# Patient Record
Sex: Male | Born: 1980 | Race: Black or African American | Hispanic: No | Marital: Single | Smoking: Current every day smoker
Health system: Southern US, Community
[De-identification: ages and names within clinical notes are randomized; demographics above are authoritative.]

## PROBLEM LIST (undated history)

## (undated) DIAGNOSIS — J9383 Other pneumothorax: Secondary | ICD-10-CM

## (undated) HISTORY — PX: CHEST TUBE INSERTION: SHX231

---

## 2008-05-05 ENCOUNTER — Emergency Department (HOSPITAL_COMMUNITY): Admission: EM | Admit: 2008-05-05 | Discharge: 2008-05-05 | Payer: Self-pay | Admitting: Emergency Medicine

## 2008-11-13 ENCOUNTER — Emergency Department (HOSPITAL_COMMUNITY): Admission: EM | Admit: 2008-11-13 | Discharge: 2008-11-13 | Payer: Self-pay | Admitting: Internal Medicine

## 2011-02-07 LAB — GC/CHLAMYDIA PROBE AMP, GENITAL
Chlamydia, DNA Probe: NEGATIVE
GC Probe Amp, Genital: NEGATIVE

## 2015-01-30 ENCOUNTER — Emergency Department (HOSPITAL_COMMUNITY)
Admission: EM | Admit: 2015-01-30 | Discharge: 2015-01-30 | Disposition: A | Discharge status: Home / Self Care | Payer: Self-pay | Attending: Emergency Medicine | Admitting: Emergency Medicine

## 2015-01-30 ENCOUNTER — Encounter (HOSPITAL_COMMUNITY): Payer: Self-pay | Admitting: Emergency Medicine

## 2015-01-30 DIAGNOSIS — R197 Diarrhea, unspecified: Secondary | ICD-10-CM

## 2015-01-30 DIAGNOSIS — A084 Viral intestinal infection, unspecified: Secondary | ICD-10-CM

## 2015-01-30 DIAGNOSIS — Z720 Tobacco use: Secondary | ICD-10-CM | POA: Insufficient documentation

## 2015-01-30 DIAGNOSIS — E876 Hypokalemia: Secondary | ICD-10-CM | POA: Insufficient documentation

## 2015-01-30 DIAGNOSIS — A0839 Other viral enteritis: Secondary | ICD-10-CM | POA: Insufficient documentation

## 2015-01-30 DIAGNOSIS — R110 Nausea: Secondary | ICD-10-CM

## 2015-01-30 LAB — CBC WITH DIFFERENTIAL/PLATELET
BASOS ABS: 0 10*3/uL (ref 0.0–0.1)
Basophils Relative: 0 % (ref 0–1)
EOS PCT: 1 % (ref 0–5)
Eosinophils Absolute: 0.2 10*3/uL (ref 0.0–0.7)
HCT: 43.2 % (ref 39.0–52.0)
Hemoglobin: 14.8 g/dL (ref 13.0–17.0)
LYMPHS PCT: 30 % (ref 12–46)
Lymphs Abs: 3.2 10*3/uL (ref 0.7–4.0)
MCH: 31.2 pg (ref 26.0–34.0)
MCHC: 34.3 g/dL (ref 30.0–36.0)
MCV: 91.1 fL (ref 78.0–100.0)
Monocytes Absolute: 0.6 10*3/uL (ref 0.1–1.0)
Monocytes Relative: 6 % (ref 3–12)
NEUTROS PCT: 63 % (ref 43–77)
Neutro Abs: 6.5 10*3/uL (ref 1.7–7.7)
PLATELETS: 226 10*3/uL (ref 150–400)
RBC: 4.74 MIL/uL (ref 4.22–5.81)
RDW: 13.3 % (ref 11.5–15.5)
WBC: 10.5 10*3/uL (ref 4.0–10.5)

## 2015-01-30 LAB — COMPREHENSIVE METABOLIC PANEL
ALBUMIN: 4.2 g/dL (ref 3.5–5.2)
ALK PHOS: 78 U/L (ref 39–117)
ALT: 14 U/L (ref 0–53)
ANION GAP: 9 (ref 5–15)
AST: 23 U/L (ref 0–37)
BUN: 10 mg/dL (ref 6–23)
CO2: 23 mmol/L (ref 19–32)
Calcium: 9 mg/dL (ref 8.4–10.5)
Chloride: 105 mmol/L (ref 96–112)
Creatinine, Ser: 1.22 mg/dL (ref 0.50–1.35)
GFR calc Af Amer: 89 mL/min — ABNORMAL LOW (ref >90)
GFR calc non Af Amer: 77 mL/min — ABNORMAL LOW (ref >90)
Glucose, Bld: 94 mg/dL (ref 70–99)
POTASSIUM: 3.4 mmol/L — AB (ref 3.5–5.1)
SODIUM: 137 mmol/L (ref 135–145)
TOTAL PROTEIN: 7.2 g/dL (ref 6.0–8.3)
Total Bilirubin: 0.6 mg/dL (ref 0.3–1.2)

## 2015-01-30 LAB — URINALYSIS, ROUTINE W REFLEX MICROSCOPIC
Bilirubin Urine: NEGATIVE
Glucose, UA: NEGATIVE mg/dL
Hgb urine dipstick: NEGATIVE
Ketones, ur: NEGATIVE mg/dL
LEUKOCYTES UA: NEGATIVE
Nitrite: NEGATIVE
PROTEIN: NEGATIVE mg/dL
SPECIFIC GRAVITY, URINE: 1.035 — AB (ref 1.005–1.030)
UROBILINOGEN UA: 1 mg/dL (ref 0.0–1.0)
pH: 6 (ref 5.0–8.0)

## 2015-01-30 LAB — LIPASE, BLOOD: Lipase: 30 U/L (ref 11–59)

## 2015-01-30 MED ORDER — ONDANSETRON 4 MG PO TBDP
8.0000 mg | ORAL_TABLET | Freq: Once | ORAL | Status: AC
  Administered 2015-01-30: 8 mg via ORAL
  Filled 2015-01-30: qty 2

## 2015-01-30 MED ORDER — ONDANSETRON HCL 8 MG PO TABS: 8.0000 mg | ORAL_TABLET | Freq: Three times a day (TID) | ORAL | Status: DC | PRN

## 2015-01-30 MED ORDER — POTASSIUM CHLORIDE CRYS ER 20 MEQ PO TBCR
40.0000 meq | EXTENDED_RELEASE_TABLET | Freq: Once | ORAL | Status: AC
  Administered 2015-01-30: 40 meq via ORAL
  Filled 2015-01-30: qty 2

## 2015-01-30 NOTE — Discharge Instructions (Signed)
Use zofran as prescribed, as needed for nausea. Stay well hydrated with small sips of fluids throughout the day. Follow a BRAT (banana-rice-applesauce-toast) diet as described below for the next 24-48 hours. The 'BRAT' diet is suggested, then progress to diet as tolerated as symptoms abate. Follow up with [facility] in 1 week for recheck of symptoms. Call if bloody stools, persistent diarrhea, vomiting, fever or abdominal pain. Return to ER for changing or worsening of symptoms.  Food Choices to Help Relieve Diarrhea When you have diarrhea, the foods you eat and your eating habits are very important. Choosing the right foods and drinks can help relieve diarrhea. Also, because diarrhea can last up to 7 days, you need to replace lost fluids and electrolytes (such as sodium, potassium, and chloride) in order to help prevent dehydration.  WHAT GENERAL GUIDELINES DO I NEED TO FOLLOW?  Slowly drink 1 cup (8 oz) of fluid for each episode of diarrhea. If you are getting enough fluid, your urine will be clear or pale yellow.  Eat starchy foods. Some good choices include white rice, white toast, pasta, low-fiber cereal, baked potatoes (without the skin), saltine crackers, and bagels.  Avoid large servings of any cooked vegetables.  Limit fruit to two servings per day. A serving is  cup or 1 small piece.  Choose foods with less than 2 g of fiber per serving.  Limit fats to less than 8 tsp (38 g) per day.  Avoid fried foods.  Eat foods that have probiotics in them. Probiotics can be found in certain dairy products.  Avoid foods and beverages that may increase the speed at which food moves through the stomach and intestines (gastrointestinal tract). Things to avoid include:  High-fiber foods, such as dried fruit, raw fruits and vegetables, nuts, seeds, and whole grain foods.  Spicy foods and high-fat foods.  Foods and beverages sweetened with high-fructose corn syrup, honey, or sugar  alcohols such as xylitol, sorbitol, and mannitol. WHAT FOODS ARE RECOMMENDED? Grains White rice. White, [place], or pita breads (fresh or toasted), including plain rolls, buns, or bagels. White pasta. Saltine, soda, or graham crackers. Pretzels. Low-fiber cereal. Cooked cereals made with water (such as cornmeal, farina, or cream cereals). Plain muffins. Matzo. Melba toast. Zwieback.  Vegetables Potatoes (without the skin). Strained tomato and vegetable juices. Most well-cooked and canned vegetables without seeds. Tender lettuce. Fruits Cooked or canned applesauce, apricots, cherries, fruit cocktail, grapefruit, peaches, pears, or plums. Fresh bananas, apples without skin, cherries, grapes, cantaloupe, grapefruit, peaches, oranges, or plums.  Meat and Other Protein Products Baked or boiled chicken. Eggs. Tofu. Fish. Seafood. Smooth peanut butter. Ground or well-cooked tender beef, ham, veal, lamb, pork, or poultry.  Dairy Plain yogurt, kefir, and unsweetened liquid yogurt. Lactose-free milk, buttermilk, or soy milk. Plain hard cheese. Beverages Sport drinks. Clear broths. Diluted fruit juices (except prune). Regular, caffeine-free sodas such as ginger ale. Water. Decaffeinated teas. Oral rehydration solutions. Sugar-free beverages not sweetened with sugar alcohols. Other Bouillon, broth, or soups made from recommended foods.  The items listed above may not be a complete list of recommended foods or beverages. Contact your dietitian for more options. WHAT FOODS ARE NOT RECOMMENDED? Grains Whole grain, whole wheat, bran, or rye breads, rolls, pastas, crackers, and cereals. Wild or brown rice. Cereals that contain more than 2 g of fiber per serving. Corn tortillas or taco shells. Cooked or dry oatmeal. Granola. Popcorn. Vegetables Raw vegetables. Cabbage, broccoli, Brussels sprouts, artichokes, baked beans, beet greens, corn,  kale, legumes, peas, sweet potatoes, and yams. Potato skins. Cooked spinach  and cabbage. Fruits Dried fruit, including raisins and dates. Raw fruits. Stewed or dried prunes. Fresh apples with skin, apricots, mangoes, pears, raspberries, and strawberries.  Meat and Other Protein Products Chunky peanut butter. Nuts and seeds. Beans and lentils. Tomasa BlaseBacon.  Dairy High-fat cheeses. Milk, chocolate milk, and beverages made with milk, such as milk shakes. Cream. Ice cream. Sweets and Desserts Sweet rolls, doughnuts, and sweet breads. Pancakes and waffles. Fats and Oils Butter. Cream sauces. Margarine. Salad oils. Plain salad dressings. Olives. Avocados.  Beverages Caffeinated beverages (such as coffee, tea, soda, or energy drinks). Alcoholic beverages. Fruit juices with pulp. Prune juice. Soft drinks sweetened with high-fructose corn syrup or sugar alcohols. Other Coconut. Hot sauce. Chili powder. Mayonnaise. Gravy. Cream-based or milk-based soups.  The items listed above may not be a complete list of foods and beverages to avoid. Contact your dietitian for more information. WHAT SHOULD I DO IF I BECOME DEHYDRATED? Diarrhea can sometimes lead to dehydration. Signs of dehydration include dark urine and dry mouth and skin. If you think you are dehydrated, you should rehydrate with an oral rehydration solution. These solutions can be purchased at pharmacies, retail stores, or online.  Drink -1 cup (120-240 mL) of oral rehydration solution each time you have an episode of diarrhea. If drinking this amount makes your diarrhea worse, try drinking smaller amounts more often. For example, drink 1-3 tsp (5-15 mL) every 5-10 minutes.  A general rule for staying hydrated is to drink 1-2 L of fluid per day. Talk to your health care provider about the specific amount you should be drinking each day. Drink enough fluids to keep your urine clear or pale yellow. Document Released: 12/31/2003 Document Revised: 10/15/2013 Document Reviewed: 09/02/2013 [facility] Patient Information 2015  [place]. This information is not intended to replace advice given to you by your health care provider. Make sure you discuss any questions you have with your health care provider.   Diarrhea Diarrhea is frequent loose and watery bowel movements. It can cause you to feel weak and dehydrated. Dehydration can cause you to become tired and thirsty, have a dry mouth, and have decreased urination that often is dark yellow. Diarrhea is a sign of another problem, most often an infection that will not last long. In most cases, diarrhea typically lasts 2-3 days. However, it can last longer if it is a sign of something more serious. It is important to treat your diarrhea as directed by your caregiver to lessen or prevent future episodes of diarrhea. CAUSES  Some common causes include:  Gastrointestinal infections caused by viruses, bacteria, or parasites.  Food poisoning or food allergies.  Certain medicines, such as antibiotics, chemotherapy, and laxatives.  Artificial sweeteners and fructose.  Digestive disorders. HOME CARE INSTRUCTIONS  Ensure adequate fluid intake (hydration): Have 1 cup (8 oz) of fluid for each diarrhea episode. Avoid fluids that contain simple sugars or sports drinks, fruit juices, whole milk products, and sodas. Your urine should be clear or pale yellow if you are drinking enough fluids. Hydrate with an oral rehydration solution that you can purchase at pharmacies, retail stores, and online. You can prepare an oral rehydration solution at home by mixing the following ingredients together:   - tsp table salt.   tsp baking soda.   tsp salt substitute containing potassium chloride.  1  tablespoons sugar.  1 L (34 oz) of water.  Certain foods and beverages may increase  the speed at which food moves through the gastrointestinal (GI) tract. These foods and beverages should be avoided and include:  Caffeinated and alcoholic beverages.  High-fiber foods, such as raw  fruits and vegetables, nuts, seeds, and whole grain breads and cereals.  Foods and beverages sweetened with sugar alcohols, such as xylitol, sorbitol, and mannitol.  Some foods may be well tolerated and may help thicken stool including:  Starchy foods, such as rice, toast, pasta, low-sugar cereal, oatmeal, grits, baked potatoes, crackers, and bagels.  Bananas.  Applesauce.  Add probiotic-rich foods to help increase healthy bacteria in the GI tract, such as yogurt and fermented milk products.  Wash your hands well after each diarrhea episode.  Only take over-the-counter or prescription medicines as directed by your caregiver.  Take a warm bath to relieve any burning or pain from frequent diarrhea episodes. SEEK IMMEDIATE MEDICAL CARE IF:   You are unable to keep fluids down.  You have persistent vomiting.  You have blood in your stool, or your stools are black and tarry.  You do not urinate in 6-8 hours, or there is only a small amount of very dark urine.  You have abdominal pain that increases or localizes.  You have weakness, dizziness, confusion, or light-headedness.  You have a severe headache.  Your diarrhea gets worse or does not get better.  You have a fever or persistent symptoms for more than 2-3 days.  You have a fever and your symptoms suddenly get worse. MAKE SURE YOU:   Understand these instructions.  Will watch your condition.  Will get help right away if you are not doing well or get worse. Document Released: 09/30/2002 Document Revised: 02/24/2014 Document Reviewed: 06/17/2012 [facility] Patient Information 2015 [place], [place]. This information is not intended to replace advice given to you by your health care provider. Make sure you discuss any questions you have with your health care provider.  Nausea, Adult Nausea is the feeling that you have an upset stomach or have to vomit. Nausea by itself is not likely a serious concern, but it may be an early  sign of more serious medical problems. As nausea gets worse, it can lead to vomiting. If vomiting develops, there is the risk of dehydration.  CAUSES   Viral infections.  Food poisoning.  Medicines.  Pregnancy.  Motion sickness.  Migraine headaches.  Emotional distress.  Severe pain from any source.  Alcohol intoxication. HOME CARE INSTRUCTIONS  Get plenty of rest.  Ask your caregiver about specific rehydration instructions.  Eat small amounts of food and sip liquids more often.  Take all medicines as told by your caregiver. SEEK MEDICAL CARE IF:  You have not improved after 2 days, or you get worse.  You have a headache. SEEK IMMEDIATE MEDICAL CARE IF:   You have a fever.  You faint.  You keep vomiting or have blood in your vomit.  You are extremely weak or dehydrated.  You have dark or bloody stools.  You have severe chest or abdominal pain. MAKE SURE YOU:  Understand these instructions.  Will watch your condition.  Will get help right away if you are not doing well or get worse. Document Released: 11/17/2004 Document Revised: 07/04/2012 Document Reviewed: 06/22/2011 [facility] Patient Information 2015 [place], [place]. This information is not intended to replace advice given to you by your health care provider. Make sure you discuss any questions you have with your health care provider.  Viral Gastroenteritis Viral gastroenteritis is also called stomach flu.  This illness is caused by a certain type of germ (virus). It can cause sudden watery poop (diarrhea) and throwing up (vomiting). This can cause you to lose body fluids (dehydration). This illness usually lasts for 3 to 8 days. It usually goes away on its own. HOME CARE   Drink enough fluids to keep your pee (urine) clear or pale yellow. Drink small amounts of fluids often.  Ask your doctor how to replace body fluid losses (rehydration).  Avoid:  Foods high in sugar.  Alcohol.  Bubbly  (carbonated) drinks.  Tobacco.  Juice.  Caffeine drinks.  Very hot or cold fluids.  Fatty, greasy foods.  Eating too much at one time.  Dairy products until 24 to 48 hours after your watery poop stops.  You may eat foods with active cultures (probiotics). They can be found in some yogurts and supplements.  Wash your hands well to avoid spreading the illness.  Only take medicines as told by your doctor. Do not give aspirin to children. Do not take medicines for watery poop (antidiarrheals).  Ask your doctor if you should keep taking your regular medicines.  Keep all doctor visits as told. GET HELP RIGHT AWAY IF:   You cannot keep fluids down.  You do not pee at least once every 6 to 8 hours.  You are short of breath.  You see blood in your poop or throw up. This may look like coffee grounds.  You have belly (abdominal) pain that gets worse or is just in one small spot (localized).  You keep throwing up or having watery poop.  You have a fever.  The patient is a child younger than 3 months, and he or she has a fever.  The patient is a child older than 3 months, and he or she has a fever and problems that do not go away.  The patient is a child older than 3 months, and he or she has a fever and problems that suddenly get worse.  The patient is a baby, and he or she has no tears when crying. MAKE SURE YOU:   Understand these instructions.  Will watch your condition.  Will get help right away if you are not doing well or get worse. Document Released: 03/28/2008 Document Revised: 01/02/2012 Document Reviewed: 07/27/2011 [facility] Patient Information 2015 [place], [place]. This information is not intended to replace advice given to you by your health care provider. Make sure you discuss any questions you have with your health care provider.

## 2015-01-30 NOTE — ED Notes (Signed)
The patient says he has had diarrhea for about 5 days.  He says he has been nausea but no diarrhea.  The patient denies any pain or any other symptoms.

## 2015-01-30 NOTE — ED Provider Notes (Signed)
CSN: [id number]     Arrival date & time 01/30/15  1915 History   First MD Initiated Contact with Patient 01/30/15 2046     Chief Complaint  Patient presents with  . Diarrhea    The patient says he has had diarrhea for about 5 days.  He says he has been nausea but no diarrhea.     (Consider location/radiation/quality/duration/timing/severity/associated sxs/prior Treatment) HPI Comments: Sharyl NimrodCedric Raul Dellston is a 34 y.o. Male who presents to the ED with complaints of diarrhea 5 days. He reports that he has had 8-10 watery nonbloody episodes of diarrhea per day, associated with some mild intermittent nausea. He has not tried anything for symptoms and has no known aggravating factors. He states he occasionally has abd pain right before he has diarrhea, but it resolves with the BM, and is not currently ongoing. He denies any fevers, chills, chest pain, shortness breath, abdominal pain, vomiting, constipation, obstipation, melena, hematochezia, dysuria, hematuria, testicular pain or swelling, penile discharge, numbness, tingling, weakness, rashes, arthralgias, myalgias, recent antibiotic use, recent travel, sick contacts, suspicious food intake, alcohol use, or NSAID use.  Patient is a 34 y.o. male presenting with diarrhea. The history is provided by the patient. No language interpreter was used.  Diarrhea Quality:  Watery Severity:  Moderate Onset quality:  Gradual Number of episodes:  8-10x/day Duration:  5 days Timing:  Constant Progression:  Unchanged Relieved by:  None tried Worsened by:  Nothing tried Ineffective treatments:  None tried Associated symptoms: no abdominal pain, no arthralgias, no chills, no fever, no myalgias, no URI and no vomiting   Risk factors: no recent antibiotic use, no sick contacts, no suspicious food intake and no travel to endemic areas     History reviewed. No pertinent past medical history. History reviewed. No pertinent past surgical history. History reviewed. No  pertinent family history. History  Substance Use Topics  . Smoking status: Current Every Day Smoker -- 1.00 packs/day    Types: Cigarettes  . Smokeless tobacco: Never Used  . Alcohol Use: No    Review of Systems  Constitutional: Negative for fever and chills.  Respiratory: Negative for shortness of breath.   Cardiovascular: Negative for chest pain.  Gastrointestinal: Positive for nausea and diarrhea. Negative for vomiting, abdominal pain, constipation, blood in stool, anal bleeding and rectal pain.  Genitourinary: Negative for dysuria, hematuria, flank pain, discharge, scrotal swelling, penile pain and testicular pain.  Musculoskeletal: Negative for myalgias and arthralgias.  Skin: Negative for rash.  Allergic/Immunologic: Negative for immunocompromised state.  Neurological: Negative for weakness and numbness.  Psychiatric/Behavioral: Negative for confusion.   10 Systems reviewed and are negative for acute change except as noted in the HPI.    Allergies  Review of patient's allergies indicates no known allergies.  Home Medications   Prior to Admission medications   Medication Sig Start Date End Date Taking? Authorizing Provider  loperamide (IMODIUM A-D) 2 MG tablet Take 2 mg by mouth 4 (four) times daily as needed for diarrhea or loose stools.   Yes Historical Provider, MD   BP 127/79 mmHg  Pulse 57  Temp(Src) 97.4 F (36.3 C) (Oral)  Resp 16  Ht 6\' 1"  (1.854 m)  Wt 193 lb (87.544 kg)  BMI 25.47 kg/m2  SpO2 100% Physical Exam  Constitutional: He is oriented to person, place, and time. Vital signs are normal. He appears well-developed and well-nourished.  Non-toxic appearance. No distress.  Afebrile, nontoxic, NAD  HENT:  Head: Normocephalic and atraumatic.  Mouth/Throat: Oropharynx  is clear and moist and mucous membranes are normal.  Eyes: Conjunctivae and EOM are normal. Right eye exhibits no discharge. Left eye exhibits no discharge.  Neck: Normal range of motion.  Neck supple.  Cardiovascular: Normal rate, regular rhythm, normal heart sounds and intact distal pulses.  Exam reveals no gallop and no friction rub.   No murmur heard. Pulmonary/Chest: Effort normal and breath sounds normal. No respiratory distress. He has no decreased breath sounds. He has no wheezes. He has no rhonchi. He has no rales.  Abdominal: Soft. Normal appearance and bowel sounds are normal. He exhibits no distension. There is no tenderness. There is no rigidity, no rebound, no guarding, no CVA tenderness, no tenderness at McBurney's point and negative Murphy's sign.  Soft, NTND, +BS throughout, no r/g/r, neg murphy's, neg mcburney's, no CVA TTP   Musculoskeletal: Normal range of motion.  Neurological: He is alert and oriented to person, place, and time. He has normal strength. No sensory deficit.  Skin: Skin is warm, dry and intact. No rash noted.  Psychiatric: He has a normal mood and affect.  Nursing note and vitals reviewed.   ED Course  Procedures (including critical care time) Labs Review Labs Reviewed  COMPREHENSIVE METABOLIC PANEL - Abnormal; Notable for the following:    Potassium 3.4 (*)    GFR calc non Af Amer 77 (*)    GFR calc Af Amer 89 (*)    All other components within normal limits  URINALYSIS, ROUTINE W REFLEX MICROSCOPIC - Abnormal; Notable for the following:    Specific Gravity, Urine 1.035 (*)    All other components within normal limits  CBC WITH DIFFERENTIAL/PLATELET  LIPASE, BLOOD    Imaging Review No results found.   EKG Interpretation None      MDM   Final diagnoses:  Nausea  Diarrhea  Viral gastroenteritis  Hypokalemia    34 y.o. male here with nausea and diarrhea x5 days. No travel, sick contacts, suspicious food intake. No recent abx. No abdominal pain on exam. Labs reveal mildly low potassium. Will replete orally. Will give zofran. Pt refused IV fluids, will give PO fluids. Likely viral gastroenteritis. Will await U/A then  reassess.  10:53 PM U/A clear. Will send home with zofran and have him f/up with [facility]. I explained the diagnosis and have given explicit precautions to return to the ER including for any other new or worsening symptoms. The patient understands and accepts the medical plan as it's been dictated and I have answered their questions. Discharge instructions concerning home care and prescriptions have been given. The patient is STABLE and is discharged to home in good condition.  BP 109/50 mmHg  Pulse 60  Temp(Src) 97.4 F (36.3 C) (Oral)  Resp 24  Ht  (1.854 m)  Wt 193 lb (87.544 kg)  BMI 25.47 kg/m2  SpO2 96%  Meds ordered this encounter  Medications  . potassium chloride SA (K-DUR,KLOR-CON) CR tablet 40 mEq    Sig:   . ondansetron (ZOFRAN-ODT) disintegrating tablet 8 mg    Sig:   . ondansetron (ZOFRAN) 8 MG tablet    Sig: Take 1 tablet (8 mg total) by mouth every 8 (eight) hours as needed for nausea or vomiting.    Dispense:  10 tablet    Refill:  0    Order Specific Question:  Supervising Provider    Answer:  Eber Hong [[id number]]       Chrisette Man Camprubi-Soms, PA-C 01/30/15 2253  Ivin Booty  Jodi Mourning, MD 01/31/15 1610

## 2015-04-24 ENCOUNTER — Encounter (HOSPITAL_COMMUNITY): Payer: Self-pay | Admitting: *Deleted

## 2015-04-24 ENCOUNTER — Emergency Department (HOSPITAL_COMMUNITY)
Admission: EM | Admit: 2015-04-24 | Discharge: 2015-04-25 | Disposition: A | Discharge status: Home / Self Care | Payer: BLUE CROSS/BLUE SHIELD | Attending: Emergency Medicine | Admitting: Emergency Medicine

## 2015-04-24 DIAGNOSIS — M546 Pain in thoracic spine: Secondary | ICD-10-CM

## 2015-04-24 DIAGNOSIS — Z720 Tobacco use: Secondary | ICD-10-CM | POA: Diagnosis not present

## 2015-04-24 DIAGNOSIS — S24109A Unspecified injury at unspecified level of thoracic spinal cord, initial encounter: Secondary | ICD-10-CM | POA: Insufficient documentation

## 2015-04-24 DIAGNOSIS — Y92410 Unspecified street and highway as the place of occurrence of the external cause: Secondary | ICD-10-CM | POA: Insufficient documentation

## 2015-04-24 DIAGNOSIS — Y9389 Activity, other specified: Secondary | ICD-10-CM | POA: Diagnosis not present

## 2015-04-24 DIAGNOSIS — Y998 Other external cause status: Secondary | ICD-10-CM | POA: Diagnosis not present
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
## 2015-04-25 MED ORDER — CYCLOBENZAPRINE HCL 10 MG PO TABS: 10.0000 mg | ORAL_TABLET | Freq: Two times a day (BID) | ORAL | Status: DC | PRN

## 2015-04-25 MED ORDER — KETOROLAC TROMETHAMINE 60 MG/2ML IM SOLN
60.0000 mg | Freq: Once | INTRAMUSCULAR | Status: AC
  Administered 2015-04-25: 60 mg via INTRAMUSCULAR
  Filled 2015-04-25: qty 2

## 2015-04-25 NOTE — Discharge Instructions (Signed)
Return to the emergency room with worsening of symptoms, new symptoms or with symptoms that are concerning , especially fevers, loss of control of bladder or bowels, numbness or tingling around genital region or anus, weakness. RICE: Rest, Ice (three cycles of 20 mins on, 89mns off at least twice a day), compression/brace, elevation. Heating pad works well for back pain. Ibuprofen 4041m(2 tablets 20076mevery 5-6 hours for 3-5 days. Flexeril for severe pain. Do not operate machinery, drive or drink alcohol while taking narcotics or muscle relaxers. Follow up with urgent care if symptoms worsen or are persistent. Read below information and follow recommendations. Back Injury Prevention Back injuries can be extremely painful and difficult to heal. After having one back injury, you are much more likely to experience another later on. It is important to learn how to avoid injuring or re-injuring your back. The following tips can help you to prevent a back injury. PHYSICAL FITNESS  Exercise regularly and try to develop good tone in your abdominal muscles. Your abdominal muscles provide a lot of the support needed by your back.  Do aerobic exercises (walking, jogging, biking, swimming) regularly.  Do exercises that increase balance and strength (tai chi, yoga) regularly. This can decrease your risk of falling and injuring your back.  Stretch before and after exercising.  Maintain a healthy weight. The more you weigh, the more stress is placed on your back. For every pound of weight, 10 times that amount of pressure is placed on the back. DIET  Talk to your caregiver about how much calcium and vitamin D you need per day. These nutrients help to prevent weakening of the bones (osteoporosis). Osteoporosis can cause broken (fractured) bones that lead to back pain.  Include good sources of calcium in your diet, such as dairy products, green, leafy vegetables, and products with calcium added  (fortified).  Include good sources of vitamin D in your diet, such as milk and foods that are fortified with vitamin D.  Consider taking a nutritional supplement or a multivitamin if needed.  Stop smoking if you smoke. POSTURE  Sit and stand up straight. Avoid leaning forward when you sit or hunching over when you stand.  Choose chairs with good low back (lumbar) support.  If you work at a desk, sit close to your work so you do not need to lean over. Keep your chin tucked in. Keep your neck drawn back and elbows bent at a right angle. Your arms should look like the letter "L."  Sit high and close to the steering wheel when you drive. Add a lumbar support to your car seat if needed.  Avoid sitting or standing in one position for too long. Take breaks to get up, stretch, and walk around at least once every hour. Take breaks if you are driving for long periods of time.  Sleep on your side with your knees slightly bent, or sleep on your back with a pillow under your knees. Do not sleep on your stomach. LIFTING, TWISTING, AND REACHING  Avoid heavy lifting, especially repetitive lifting. If you must do heavy lifting:  Stretch before lifting.  Work slowly.  Rest between lifts.  Use carts and dollies to move objects when possible.  Make several small trips instead of carrying 1 heavy load.  Ask for help when you need it.  Ask for help when moving big, awkward objects.  Follow these steps when lifting:  Stand with your feet shoulder-width apart.  Get as close to the  object as you can. Do not try to pick up heavy objects that are far from your body.  Use handles or lifting straps if they are available.  Bend at your knees. Squat down, but keep your heels off the floor.  Keep your shoulders pulled back, your chin tucked in, and your back straight.  Lift the object slowly, tightening the muscles in your legs, abdomen, and buttocks. Keep the object as close to the center of your  body as possible.  When you put a load down, use these same guidelines in reverse.  Do not:  Lift the object above your waist.  Twist at the waist while lifting or carrying a load. Move your feet if you need to turn, not your waist.  Bend over without bending at your knees.  Avoid reaching over your head, across a table, or for an object on a high surface. OTHER TIPS  Avoid wet floors and keep sidewalks clear of ice to prevent falls.  Do not sleep on a mattress that is too soft or too hard.  Keep items that are used frequently within easy reach.  Put heavier objects on shelves at waist level and lighter objects on lower or higher shelves.  Find ways to decrease your stress, such as exercise, massage, or relaxation techniques. Stress can build up in your muscles. Tense muscles are more vulnerable to injury.  Seek treatment for depression or anxiety if needed. These conditions can increase your risk of developing back pain. SEEK MEDICAL CARE IF:  You injure your back.  You have questions about diet, exercise, or other ways to prevent back injuries. MAKE SURE YOU:  Understand these instructions.  Will watch your condition.  Will get help right away if you are not doing well or get worse. Document Released: 11/17/2004 Document Revised: 01/02/2012 Document Reviewed: 11/21/2011 [facility] Patient Information 2015 [place], [place]. This information is not intended to replace advice given to you by your health care provider. Make sure you discuss any questions you have with your health care provider.

## 2015-05-19 ENCOUNTER — Encounter (HOSPITAL_COMMUNITY): Payer: Self-pay | Admitting: Emergency Medicine

## 2015-05-19 ENCOUNTER — Emergency Department (HOSPITAL_COMMUNITY)
Admission: EM | Admit: 2015-05-19 | Discharge: 2015-05-19 | Disposition: A | Discharge status: Home / Self Care | Payer: BLUE CROSS/BLUE SHIELD | Attending: Emergency Medicine | Admitting: Emergency Medicine

## 2015-05-19 DIAGNOSIS — K088 Other specified disorders of teeth and supporting structures: Secondary | ICD-10-CM | POA: Insufficient documentation

## 2015-05-19 DIAGNOSIS — Z720 Tobacco use: Secondary | ICD-10-CM | POA: Insufficient documentation

## 2015-05-19 DIAGNOSIS — K0889 Other specified disorders of teeth and supporting structures: Secondary | ICD-10-CM

## 2015-05-19 MED ORDER — NAPROXEN 250 MG PO TABS: 250.0000 mg | ORAL_TABLET | Freq: Two times a day (BID) | ORAL | Status: DC

## 2015-05-19 MED ORDER — PENICILLIN V POTASSIUM 500 MG PO TABS: 500.0000 mg | ORAL_TABLET | Freq: Four times a day (QID) | ORAL | Status: DC

## 2015-05-19 MED ORDER — ACETAMINOPHEN 325 MG PO TABS
650.0000 mg | ORAL_TABLET | Freq: Once | ORAL | Status: AC
  Administered 2015-05-19: 650 mg via ORAL
  Filled 2015-05-19: qty 2

## 2015-05-19 NOTE — ED Notes (Signed)
Pt reports L lower dental pain on molar. Has filling on that molar but thinks the filling is degrading.

## 2015-05-19 NOTE — ED Provider Notes (Signed)
CSN: [id number]     Arrival date & time 05/19/15  1025 History   First MD Initiated Contact with Patient 05/19/15 1038     Chief Complaint  Patient presents with  . Dental Pain   Kenneth Arellano is a 34 y.o. male who presents to the ED complaining of left lower dental pain for several months that has worsened in the past week. He reports a cracked left lower molar. He complains of 8/10 pain and he has taken nothing for treatment today. He denies fevers, sore throat, trouble swallowing, ear pain, mouth discharge, neck pain, or facial swelling.   (Consider location/radiation/quality/duration/timing/severity/associated sxs/prior Treatment) HPI  History reviewed. No pertinent past medical history. History reviewed. No pertinent past surgical history. History reviewed. No pertinent family history. History  Substance Use Topics  . Smoking status: Current Every Day Smoker -- 1.00 packs/day    Types: Cigarettes  . Smokeless tobacco: Never Used  . Alcohol Use: No    Review of Systems  Constitutional: Negative for fever and chills.  HENT: Positive for dental problem. Negative for drooling, ear discharge, ear pain, facial swelling, mouth sores, nosebleeds, rhinorrhea, sore throat and trouble swallowing.   Musculoskeletal: Negative for neck pain and neck stiffness.  Skin: Negative for rash.  Neurological: Negative for light-headedness.      Allergies  Review of patient's allergies indicates no known allergies.  Home Medications   Prior to Admission medications   Medication Sig Start Date End Date Taking? Authorizing Provider  cyclobenzaprine (FLEXERIL) 10 MG tablet Take 1 tablet (10 mg total) by mouth 2 (two) times daily as needed for muscle spasms. 04/25/15   Oswaldo Conroy, PA-C  loperamide (IMODIUM A-D) 2 MG tablet Take 2 mg by mouth 4 (four) times daily as needed for diarrhea or loose stools.    Historical Provider, MD  naproxen (NAPROSYN) 250 MG tablet Take 1 tablet (250 mg total) by  mouth 2 (two) times daily with a meal. 05/19/15   Everlene Farrier, PA-C  ondansetron (ZOFRAN) 8 MG tablet Take 1 tablet (8 mg total) by mouth every 8 (eight) hours as needed for nausea or vomiting. 01/30/15   Mercedes Camprubi-Soms, PA-C  penicillin v potassium (VEETID) 500 MG tablet Take 1 tablet (500 mg total) by mouth 4 (four) times daily. 05/19/15   Everlene Farrier, PA-C   BP 102/69 mmHg  Pulse 57  Temp(Src) 98.2 F (36.8 C) (Oral)  Resp 16  SpO2 100% Physical Exam  Constitutional: He appears well-developed and well-nourished. No distress.  Non-toxic appearing.   HENT:  Head: Normocephalic and atraumatic.  Right Ear: External ear normal.  Left Ear: External ear normal.  Mouth/Throat: Oropharynx is clear and moist. No oropharyngeal exudate.  Tenderness to left lower molar which is cracked.  No discharge from the mouth. No facial swelling.  Uvula is midline without edema. Soft palate rises symmetrically. No tonsillar hypertrophy or exudates. Tongue protrusion is normal.  Bilateral tympanic membranes are pearly-gray without erythema or loss of landmarks.   Eyes: Conjunctivae are normal. Pupils are equal, round, and reactive to light. Right eye exhibits no discharge. Left eye exhibits no discharge.  Neck: Normal range of motion. Neck supple. No JVD present. No tracheal deviation present.  Cardiovascular: Normal rate and intact distal pulses.   Pulmonary/Chest: Effort normal. No respiratory distress.  Lymphadenopathy:    He has no cervical adenopathy.  Neurological: No cranial nerve deficit. Coordination normal.  Skin: Skin is warm and dry. No rash noted. He is not diaphoretic. No  erythema. No pallor.  Psychiatric: He has a normal mood and affect. His behavior is normal.  Nursing note and vitals reviewed.   ED Course  Procedures (including critical care time) Labs Review Labs Reviewed - No data to display  Imaging Review No results found.   EKG Interpretation None      Filed  Vitals:   05/19/15 1041  BP: 102/69  Pulse: 57  Temp: 98.2 F (36.8 C)  TempSrc: Oral  Resp: 16  SpO2: 100%    Meds given in ED:  Medications  acetaminophen (TYLENOL) tablet 650 mg (650 mg Oral Given 05/19/15 1113)    New Prescriptions   NAPROXEN (NAPROSYN) 250 MG TABLET    Take 1 tablet (250 mg total) by mouth 2 (two) times daily with a meal.   PENICILLIN V POTASSIUM (VEETID) 500 MG TABLET    Take 1 tablet (500 mg total) by mouth 4 (four) times daily.     MDM   Final diagnoses:  Pain, dental   Patient with toothache to left lower molar.  No gross abscess.  Exam unconcerning for Ludwig's angina or spread of infection.  Will treat with penicillin and pain medicine.  Urged patient to follow-up with dentist.  I advised the patient to follow-up with their primary care provider this week. I advised the patient to return to the emergency department with new or worsening symptoms or new concerns. The patient verbalized understanding and agreement with plan.     Everlene Farrier, PA-C 05/19/15 1114  Azalia Bilis, MD 05/19/15 1116

## 2015-05-19 NOTE — Discharge Instructions (Signed)
Dental Pain °A tooth ache may be caused by cavities (tooth decay). Cavities expose the nerve of the tooth to air and hot or cold temperatures. It may come from an infection or abscess (also called a boil or furuncle) around your tooth. It is also often caused by dental caries (tooth decay). This causes the pain you are having. °DIAGNOSIS  °Your caregiver can diagnose this problem by exam. °TREATMENT  °· If caused by an infection, it may be treated with medications which kill germs (antibiotics) and pain medications as prescribed by your caregiver. Take medications as directed. °· Only take over-the-counter or prescription medicines for pain, discomfort, or fever as directed by your caregiver. °· Whether the tooth ache today is caused by infection or dental disease, you should see your dentist as soon as possible for further care. °SEEK MEDICAL CARE IF: °The exam and treatment you received today has been provided on an emergency basis only. This is not a substitute for complete medical or dental care. If your problem worsens or new problems (symptoms) appear, and you are unable to meet with your dentist, call or return to this location. °SEEK IMMEDIATE MEDICAL CARE IF:  °· You have a fever. °· You develop redness and swelling of your face, jaw, or neck. °· You are unable to open your mouth. °· You have severe pain uncontrolled by pain medicine. °MAKE SURE YOU:  °· Understand these instructions. °· Will watch your condition. °· Will get help right away if you are not doing well or get worse. °Document Released: 10/10/2005 Document Revised: 01/02/2012 Document Reviewed: 05/28/2008 °ExitCare® Patient Information ©2015 [employer]. This information is not intended to replace advice given to you by your health care provider. Make sure you discuss any questions you have with your health care provider. ° °Emergency Department Resource Guide °1) Find a Doctor and Pay Out of Pocket °Although you won't have to find out who  is covered by your insurance plan, it is a good idea to ask around and get recommendations. You will then need to call the office and see if the doctor you have chosen will accept you as a new patient and what types of options they offer for patients who are self-pay. Some doctors offer discounts or will set up payment plans for their patients who do not have insurance, but you will need to ask so you aren't surprised when you get to your appointment. ° °2) Contact Your Local Health Department °Not all health departments have doctors that can see patients for sick visits, but many do, so it is worth a call to see if yours does. If you don't know where your local health department is, you can check in your phone book. The CDC also has a tool to help you locate your state's health department, and many state websites also have listings of all of their local health departments. ° °3) Find a Walk-in Clinic °If your illness is not likely to be very severe or complicated, you may want to try a walk in clinic. These are popping up all over the country in pharmacies, drugstores, and shopping centers. They're usually staffed by nurse practitioners or physician assistants that have been trained to treat common illnesses and complaints. They're usually fairly quick and inexpensive. However, if you have serious medical issues or chronic medical problems, these are probably not your best option. ° °No Primary Care Doctor: °- Call [facility] at  832-8000 - they can help you locate a primary   care doctor that  accepts your insurance, provides certain services, etc. °- Physician Referral Service- 1-800-533-3463 ° °Chronic Pain Problems: °Organization         Address  Phone   Notes  °[facility]  (336) 297-2271 Patients need to be referred by their primary care doctor.  ° °Medication Assistance: °Organization         Address  Phone   Notes  °[place] [facility] [address]  [address] °[place], [place] [address] (336) 641-8030 --Must be a resident of [place] °-- Must have NO insurance coverage whatsoever (no Medicaid/ Medicare, etc.) °-- The pt. MUST have a primary care doctor that directs their care regularly and follows them in the community °  °[facility]  (866) 331-1348   °[employer]  (888) 892-1162   ° °Agencies that provide inexpensive medical care: °Organization         Address  Phone   Notes  °[facility]  (336) 832-8035   °[facility]    (336) 832-7272   °[facility] [address] °[place], [place] [address] (336) 832-4777   °[facility] [address], °[place] (336) 271-4999   °[facility]    (336) 373-0678   °[facility]    (336) 272-1050   °[facility] ° [address], [place] Phone:  (336) 832-4444, Fax:  (336) 832-4440 Hours of Operation:  9 am - 6 pm, M-F.  Also accepts Medicaid/Medicare and self-pay.  °[facility] ° [address], [place] Phone: (336) 832-3150, Fax: (336) 832-3151. Hours of Operation:  8:30 am - 5:30 pm, M-F.  Also accepts Medicaid and self-pay.  °[facility] [place] [address], [place] Phone: (336) 878-6027   °[facility] [address], [place], [place] (336)723-1848, Ext. 123 Mondays & Thursdays: 7-9 AM.  First 15 patients are seen on a first come, first serve basis. °  ° °Medicaid-accepting [place] Providers: ° °Organization         Address  Phone   Notes  °[facility] [address], [place] (336) 641-2100 Also accepts self-pay patients.  °[facility] [address], [place] ° (336) 856-9996   °[facility] [address] [place] [address], [place] (336) 288-8857   °[facility] [address] [place] [address], [place] (336) 299-7000   °Veita Bland [address], [place]  ° (336) 373-1557 Only accepts [place] Access Medicaid patients after they have their name applied to their card.  ° °Self-Pay (no insurance) in [place]: ° °Organization         Address  Phone   Notes  °Sickle Cell Patients, [facility] [address], [place] (336) 832-1970   °[facility] [address], [place] (336) 832-4400   °[facility] ° [address], [place] (336) 992-4800   °[facility]/Dr. Osei-Bonsu ° [address] [place] [address], [place] or [address], [place] (336) 841-8500 Phone number for both [place] and [place] locations is the same.  °[facility] [address], [place] (336) 299-0000   °[facility] [address] [place] [address], [place] or [address] (336) 852-7530 °(336) 878-2260   °  [facility] [address], [place] (336) 350-1642, phone; (336) 294-5005, fax Sees patients 1st and 3rd Saturday of every month.  Must not qualify for public or private insurance (i.e. Medicaid, Medicare, [place] Health Choice, Veterans' Benefits) • Household income should be no more than 200% of the poverty level •The clinic cannot treat you if you are pregnant or think you are pregnant • Sexually transmitted diseases are not treated at the clinic.  ° ° °Dental Care: °Organization         Address  Phone  Notes  °[facility] [address], [place] (336) 641-6152 Accepts children up to age 21 who are enrolled in Medicaid or [place] Health Choice; pregnant women with a Medicaid card; and children who have applied for Medicaid or [place] Health Choice, but were declined, whose parents can pay a reduced fee at time of service.  °[facility] [place]  [address], [place] (336) 641-7733 Accepts children up to age 21 who are  enrolled in Medicaid or [place] Health Choice; pregnant women with a Medicaid card; and children who have applied for Medicaid or [place] Health Choice, but were declined, whose parents can pay a reduced fee at time of service.  °[facility] ° [address], [place] (336) 641-4533 Patients are seen by appointment only. Walk-ins are not accepted. [facility] will see patients 18 years of age and older. °Monday - Tuesday (8am-5pm) °Most Wednesdays (8:30-5pm) °$30 per visit, cash only  °[facility] ° [address], [place] (336) 641-4533 Patients are seen by appointment only. Walk-ins are not accepted. [facility] will see patients 18 years of age and older. °One Wednesday Evening (Monthly: Volunteer Based).  $30 per visit, cash only  °[facility]  (919) 537-3737 for adults; Children under age 4, call Graduate Pediatric Dentistry at (919) [phone]. Children aged 4-14, please call (919) [phone] to request a pediatric application. ° Dental services are provided in all areas of dental care including fillings, crowns and bridges, complete and partial dentures, implants, gum treatment, root canals, and extractions. Preventive care is also provided. Treatment is provided to both adults and children. °Patients are selected via a lottery and there is often a waiting list. °  °[facility] [address], °[place] ° (336) 763-8833 [email or web address] °  °[facility] [address], [place], [place] (336)723-1848, Ext. 123 Second and Fourth Thursday of each month, opens at 6:30 AM; Clinic ends at 9 AM.  Patients are seen on a first-come first-served basis, and a limited number are seen during each clinic.  ° °[facility] ° [address], [place], [place] (336) 723-7904   Eligibility Requirements °You must have lived in [place], [place], or [place] counties for at least the last three months. °  You  cannot be eligible for state or federal sponsored healthcare insurance, including Veterans Administration, Medicaid, or Medicare. °  You generally cannot be eligible for healthcare insurance through your employer.  °  How to apply: °Eligibility screenings are held every Tuesday and Wednesday afternoon from 1:00 pm until 4:00 pm. You do not need an appointment for the interview!  °[facility] [address], [place], [place] 336-631-2330   °[facility]  336-342-8273   °[facility]  336-703-3100   °[facility]  336-570-6415   ° °Behavioral Health Resources in the Community: °Intensive Outpatient Programs °Organization         Address  Phone  Notes  °[facility] [address], [place], [place] 336-878-6098   °[facility] [address], [place], [place] 336-832-9800   °[facility] [address], [place], [place] ° 336-882-2125   °[facility] [address],  °[place], [place] 1-800-853-5163 or 336-641-4981   °Substance Abuse Resources °Organization         Address  Phone  Notes  °[facility]  336-882-2125   °[facility]  336-784-9470   °[facility]  336-285-9073   °[facility]  336-845-3988   °Residential & Outpatient Substance Abuse Program  1-800-659-3381   °Psychological Services °Organization         Address  Phone  Notes  °[facility]  336- 832-9600   °[facility]  336- 378-7881   °[facility] [address], [place] 1-800-853-5163 or 336-641-4981   ° °Mobile Crisis Teams °Organization         Address  Phone  Notes  °[facility], Mobile Crisis Care Unit  1-877-626-1772   °Assertive °[facility] ° [address] [place], [place] 336-834-9664   °Sharon DeEsch [address] °[place] 336-554-5454   ° °Self-Help/Support  Groups °Organization         Address  Phone             Notes  °[facility] [place] - variety of support groups  336- 373-1402 Call for more information  °Narcotics Anonymous (NA), [facility] [address], °[place]  2 meetings at this location  ° °Residential Treatment Programs °Organization         Address  Phone  Notes  °[facility] [address],    °[place]  1-866-801-8205   °[facility] ° [address], [place], [place] 704-293-8524   °[facility] [address], [place] 336-845-3988 Admissions: 8am-3pm M-F  °[facility] [address]    °[place], [place] 336-841-1104   °[facility] [address], [place], [place] 336-379-7146   °[facility] [address]  °[place], [place] 336-285-9073   °[facility] - Intensive Outpatient [address], [place], [place] 336-852-3033   °[facility] [address]  °[place], [place] 1-877-615-2722 or 336-784-9470   °[facility] [address] [place], [place] 336-227-7417 Accepts Medicaid  °[facility] [address]  °[place] 1-800-659-3381 Substance Abuse/Addiction Treatment  ° °[place] Behavioral Health Resources °Organization         Address  Phone  Notes  °[facility]  (888) 581-9988   °Julie Brannon, PhD [address] [place], [place]   (336) 349-5553 or (336) 951-0000   °[facility]   [address] °[place], [place] (336) 349-4454   °[facility] [address], [place],  (336) 342-8316 Insurance/Medicaid/sponsorship through [facility]  °[facility] [address]                                    [place],  (336) 342-8316 Therapy/tele-psych/case  °[facility]   [address]  ° [place], [place] (336) 349-2233    °Dr. Arfeen  (336) 349-4544   °[facility] [place]  [employer] [facility] [place] [facility] 1) [address], [place] °2) [address], [place] °3)  [address], [place] (336) 349-3220 °(336) 342-7768 ° °(336) 342-8140   °[place] [facility] (336) 342-1394 or (336) 342-3537 (After Hours)    ° ° ° °

## 2015-07-07 ENCOUNTER — Emergency Department (INDEPENDENT_AMBULATORY_CARE_PROVIDER_SITE_OTHER)
Admission: EM | Admit: 2015-07-07 | Discharge: 2015-07-07 | Disposition: A | Discharge status: Home / Self Care | Payer: BLUE CROSS/BLUE SHIELD | Source: Home / Self Care | Attending: Family Medicine | Admitting: Family Medicine

## 2015-07-07 ENCOUNTER — Encounter (HOSPITAL_COMMUNITY): Payer: Self-pay | Admitting: Emergency Medicine

## 2015-07-07 DIAGNOSIS — K029 Dental caries, unspecified: Secondary | ICD-10-CM | POA: Diagnosis not present

## 2015-07-07 MED ORDER — DICLOFENAC SODIUM 75 MG PO TBEC: 75.0000 mg | DELAYED_RELEASE_TABLET | Freq: Two times a day (BID) | ORAL | Status: DC

## 2015-07-07 MED ORDER — AMOXICILLIN 875 MG PO TABS: 875.0000 mg | ORAL_TABLET | Freq: Two times a day (BID) | ORAL | Status: DC

## 2015-07-07 NOTE — ED Provider Notes (Addendum)
CSN: [id number]     Arrival date & time 07/07/15  1416 History   First MD Initiated Contact with Patient 07/07/15 1458     Chief Complaint  Patient presents with  . Dental Pain   (Consider location/radiation/quality/duration/timing/severity/associated sxs/prior Treatment) Patient is a 34 y.o. male presenting with tooth pain. The history is provided by the patient and a significant other.  Dental Pain Location:  Lower Lower teeth location:  18/LL 2nd molar Quality:  Dull and aching Severity:  Severe Onset quality:  Gradual Duration:  1 month Timing:  Constant Progression:  Worsening Chronicity:  Recurrent Context: dental fracture   Relieved by:  Nothing Ineffective treatments:  Topical anesthetic gel and NSAIDs Associated symptoms: gum swelling and headaches     History reviewed. No pertinent past medical history. History reviewed. No pertinent past surgical history. History reviewed. No pertinent family history. Social History  Substance Use Topics  . Smoking status: Current Every Day Smoker -- 1.00 packs/day    Types: Cigarettes  . Smokeless tobacco: Never Used  . Alcohol Use: No    Review of Systems  Constitutional: Negative.   Eyes: Negative.   Respiratory: Negative.   Cardiovascular: Negative.   Neurological: Positive for headaches.    Allergies  Review of patient's allergies indicates no known allergies.  Home Medications   Prior to Admission medications   Medication Sig Start Date End Date Taking? Authorizing Provider  cyclobenzaprine (FLEXERIL) 10 MG tablet Take 1 tablet (10 mg total) by mouth 2 (two) times daily as needed for muscle spasms. 04/25/15   Oswaldo Conroy, PA-C  loperamide (IMODIUM A-D) 2 MG tablet Take 2 mg by mouth 4 (four) times daily as needed for diarrhea or loose stools.    Historical Provider, MD  naproxen (NAPROSYN) 250 MG tablet Take 1 tablet (250 mg total) by mouth 2 (two) times daily with a meal. 05/19/15   Everlene Farrier, PA-C   ondansetron (ZOFRAN) 8 MG tablet Take 1 tablet (8 mg total) by mouth every 8 (eight) hours as needed for nausea or vomiting. 01/30/15   Mercedes Camprubi-Soms, PA-C  penicillin v potassium (VEETID) 500 MG tablet Take 1 tablet (500 mg total) by mouth 4 (four) times daily. 05/19/15   Everlene Farrier, PA-C   Meds Ordered and Administered this Visit  Medications - No data to display  BP 155/84 mmHg  Pulse 91  Temp(Src) 99.3 F (37.4 C) (Oral)  Resp 16  SpO2 98% No data found.   Physical Exam  Constitutional: He appears well-developed and well-nourished.  HENT:  Head: Normocephalic and atraumatic.  Left Ear: External ear normal.  Almost complete disappearance of tooth #18 was just the rim remaining. There is some gingival swelling and erythema as well.  Cardiovascular: Normal rate and regular rhythm.   Nursing note and vitals reviewed.   ED Course  Procedures (including critical care time)  Labs Review Labs Reviewed - No data to display  Imaging Review No results found.   Visual Acuity Review  Right Eye Distance:   Left Eye Distance:   Bilateral Distance:    Right Eye Near:   Left Eye Near:    Bilateral Near:         MDM  Chronic dental problems. Patient needs to stop drinking sodas, smoking.  Plan: Given Voltaren, amoxicillin, and told to find a dentist since he does have insurance.  This chart was scribed in my presence and reviewed by me personally.    ICD-9-CM ICD-10-CM   1. Dental  caries 521.00 K02.9 diclofenac (VOLTAREN) 75 MG EC tablet     amoxicillin (AMOXIL) 875 MG tablet     Signed, Elvina Sidle, MD   Signed, Elvina Sidle M.D.    Elvina Sidle, MD 07/07/15 1513  Elvina Sidle, MD 07/07/15 [id number]

## 2015-07-07 NOTE — ED Notes (Signed)
C/o left bottom tooth  Feels like abscess in mouth Antibiotics from 7/26 was taking as tx

## 2015-07-07 NOTE — Discharge Instructions (Signed)
You need to find a dentist.  There are many possible choices in [place]. One is Dr. Loni Beckwith who is located near [facility]. Another is [facility] which has an office on [address].

## 2015-07-28 ENCOUNTER — Encounter (HOSPITAL_COMMUNITY): Payer: Self-pay | Admitting: Emergency Medicine

## 2015-07-28 ENCOUNTER — Emergency Department (INDEPENDENT_AMBULATORY_CARE_PROVIDER_SITE_OTHER)
Admission: EM | Admit: 2015-07-28 | Discharge: 2015-07-28 | Disposition: A | Discharge status: Home / Self Care | Payer: BLUE CROSS/BLUE SHIELD | Source: Home / Self Care | Attending: Family Medicine | Admitting: Family Medicine

## 2015-07-28 DIAGNOSIS — K029 Dental caries, unspecified: Secondary | ICD-10-CM | POA: Diagnosis not present

## 2015-07-28 MED ORDER — AMOXICILLIN 875 MG PO TABS: 875.0000 mg | ORAL_TABLET | Freq: Two times a day (BID) | ORAL | Status: DC

## 2015-07-28 MED ORDER — DICLOFENAC SODIUM 75 MG PO TBEC: 75.0000 mg | DELAYED_RELEASE_TABLET | Freq: Two times a day (BID) | ORAL | Status: DC

## 2015-07-28 NOTE — ED Provider Notes (Signed)
CSN: [id number]     Arrival date & time 07/28/15  1937 History   First MD Initiated Contact with Patient 07/28/15 2020     Chief Complaint  Patient presents with  . Dental Pain   (Consider location/radiation/quality/duration/timing/severity/associated sxs/prior Treatment) Patient is a 34 y.o. male presenting with tooth pain. The history is provided by the patient. No language interpreter was used.  Dental Pain Onset quality:  Unable to specify Associated symptoms: no fever   Patient presents with complaint of left lower tooth pain, seen for same complaint in this office on 07/07/2015 and prescribed amoxicillin and voltaren, with recommendation to follow up with dentist.  He lost the medications after filling them and beginning to take them, did not complete course.  Has had some difficulty chewing on the left side of his mouth.   Denies fevers or chills, nausea/vomiting.   History reviewed. No pertinent past medical history. History reviewed. No pertinent past surgical history. No family history on file. Social History  Substance Use Topics  . Smoking status: Current Every Day Smoker -- 1.00 packs/day    Types: Cigarettes  . Smokeless tobacco: Never Used  . Alcohol Use: No    Review of Systems  Constitutional: Negative for fever, chills, activity change and fatigue.  HENT: Positive for dental problem.     Allergies  Review of patient's allergies indicates no known allergies.  Home Medications   Prior to Admission medications   Medication Sig Start Date End Date Taking? Authorizing Provider  amoxicillin (AMOXIL) 875 MG tablet Take 1 tablet (875 mg total) by mouth 2 (two) times daily. 07/28/15   Barbaraann Barthel, MD  diclofenac (VOLTAREN) 75 MG EC tablet Take 1 tablet (75 mg total) by mouth 2 (two) times daily. 07/28/15   Barbaraann Barthel, MD   Meds Ordered and Administered this Visit  Medications - No data to display  BP 125/62 mmHg  Pulse 66  Temp(Src) 98.1 F (36.7 C) (Oral)   Resp 16  SpO2 99% No data found.   Physical Exam  Constitutional: He appears well-developed and well-nourished. No distress.  HENT:  Molar on lower left is nearly completely missing, with slight erythema on surrounding gingiva.    Similar missing lower molar on right side without erythema.   Able to open mouth completely. Clear mucus membranes.   Eyes: Conjunctivae and EOM are normal. Pupils are equal, round, and reactive to light.  Neck: Normal range of motion. Neck supple.  Lymphadenopathy:    He has no cervical adenopathy.  Skin: He is not diaphoretic.    ED Course  Procedures (including critical care time)  Labs Review Labs Reviewed - No data to display  Imaging Review No results found.   Visual Acuity Review  Right Eye Distance:   Left Eye Distance:   Bilateral Distance:    Right Eye Near:   Left Eye Near:    Bilateral Near:         MDM   1. Dental caries        Barbaraann Barthel, MD 07/28/15 2027

## 2015-07-28 NOTE — ED Notes (Signed)
Left bottom tooth pain that started yesterday.  History of the same tooth causing patient pain.

## 2015-07-28 NOTE — Discharge Instructions (Signed)
It was a pleasure to see you today.   For the tooth infection, I am prescribing Amoxicillin , take 1 tablet by mouth two times daily for a total of 10 days.   Voltaren  tablets, take 1 tablet by mouth two times daily with something to eat.   It is very important that you find a dentist to address the tooth problems.

## 2015-09-14 ENCOUNTER — Encounter (HOSPITAL_COMMUNITY): Payer: Self-pay | Admitting: *Deleted

## 2015-09-14 ENCOUNTER — Emergency Department (HOSPITAL_COMMUNITY): Payer: No Typology Code available for payment source

## 2015-09-14 ENCOUNTER — Emergency Department (HOSPITAL_COMMUNITY)
Admission: EM | Admit: 2015-09-14 | Discharge: 2015-09-15 | Disposition: A | Discharge status: Home / Self Care | Payer: No Typology Code available for payment source | Attending: Emergency Medicine | Admitting: Emergency Medicine

## 2015-09-14 DIAGNOSIS — S0181XA Laceration without foreign body of other part of head, initial encounter: Secondary | ICD-10-CM | POA: Insufficient documentation

## 2015-09-14 DIAGNOSIS — S60512A Abrasion of left hand, initial encounter: Secondary | ICD-10-CM | POA: Diagnosis not present

## 2015-09-14 DIAGNOSIS — Z23 Encounter for immunization: Secondary | ICD-10-CM | POA: Insufficient documentation

## 2015-09-14 DIAGNOSIS — Y92410 Unspecified street and highway as the place of occurrence of the external cause: Secondary | ICD-10-CM | POA: Diagnosis not present

## 2015-09-14 DIAGNOSIS — S60511A Abrasion of right hand, initial encounter: Secondary | ICD-10-CM | POA: Insufficient documentation

## 2015-09-14 DIAGNOSIS — F17210 Nicotine dependence, cigarettes, uncomplicated: Secondary | ICD-10-CM | POA: Insufficient documentation

## 2015-09-14 DIAGNOSIS — M79643 Pain in unspecified hand: Secondary | ICD-10-CM

## 2015-09-14 DIAGNOSIS — Y998 Other external cause status: Secondary | ICD-10-CM | POA: Insufficient documentation

## 2015-09-14 DIAGNOSIS — R079 Chest pain, unspecified: Secondary | ICD-10-CM

## 2015-09-14 DIAGNOSIS — S299XXA Unspecified injury of thorax, initial encounter: Secondary | ICD-10-CM | POA: Diagnosis not present

## 2015-09-14 DIAGNOSIS — Y9389 Activity, other specified: Secondary | ICD-10-CM | POA: Diagnosis not present

## 2015-09-14 MED ORDER — TETANUS-DIPHTH-ACELL PERTUSSIS 5-2.5-18.5 LF-MCG/0.5 IM SUSP
0.5000 mL | Freq: Once | INTRAMUSCULAR | Status: AC
  Administered 2015-09-15: 0.5 mL via INTRAMUSCULAR
  Filled 2015-09-14: qty 0.5

## 2015-09-14 MED ORDER — OXYCODONE-ACETAMINOPHEN 5-325 MG PO TABS
2.0000 | ORAL_TABLET | Freq: Once | ORAL | Status: AC
  Administered 2015-09-15: 2 via ORAL
  Filled 2015-09-14: qty 2

## 2015-09-14 MED ORDER — OXYCODONE-ACETAMINOPHEN 5-325 MG PO TABS: 2.0000 | ORAL_TABLET | Freq: Four times a day (QID) | ORAL | Status: DC | PRN
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
## 2015-09-14 NOTE — ED Notes (Signed)
Pt does not recall his last tetanus shot.
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
## 2015-09-15 DIAGNOSIS — S299XXA Unspecified injury of thorax, initial encounter: Secondary | ICD-10-CM | POA: Diagnosis not present

## 2015-09-15 NOTE — ED Notes (Signed)
Both hands were cleaned with wrapped with dry gauze by emt prior to discharge. Gauze is clean, dry, and secured with tape.

## 2015-09-15 NOTE — Discharge Instructions (Signed)
Wear your seatbelt at all times. Talk with a primary care doctor to help you stop smoking. Return to the ED for worsening abdominal pain, chest pain, shortness of breath.

## 2016-01-21 ENCOUNTER — Encounter (HOSPITAL_COMMUNITY): Payer: Self-pay | Admitting: *Deleted

## 2016-01-21 ENCOUNTER — Emergency Department (HOSPITAL_COMMUNITY)
Admission: EM | Admit: 2016-01-21 | Discharge: 2016-01-21 | Disposition: A | Discharge status: Home / Self Care | Payer: BLUE CROSS/BLUE SHIELD | Attending: Emergency Medicine | Admitting: Emergency Medicine

## 2016-01-21 DIAGNOSIS — M545 Low back pain, unspecified: Secondary | ICD-10-CM

## 2016-01-21 DIAGNOSIS — F17210 Nicotine dependence, cigarettes, uncomplicated: Secondary | ICD-10-CM | POA: Diagnosis not present

## 2016-01-21 MED ORDER — LIDOCAINE 5 % EX PTCH
1.0000 | MEDICATED_PATCH | CUTANEOUS | Status: DC
  Administered 2016-01-21: 1 via TRANSDERMAL
  Filled 2016-01-21: qty 1

## 2016-01-21 MED ORDER — METHOCARBAMOL 500 MG PO TABS: 500.0000 mg | ORAL_TABLET | Freq: Two times a day (BID) | ORAL | Status: DC

## 2016-01-21 MED ORDER — OXYCODONE-ACETAMINOPHEN 5-325 MG PO TABS: 1.0000 | ORAL_TABLET | ORAL | Status: DC | PRN

## 2016-01-21 MED ORDER — METHOCARBAMOL 500 MG PO TABS
1000.0000 mg | ORAL_TABLET | Freq: Once | ORAL | Status: AC
  Administered 2016-01-21: 1000 mg via ORAL
  Filled 2016-01-21: qty 2

## 2016-01-21 MED ORDER — NAPROXEN 500 MG PO TABS: 500.0000 mg | ORAL_TABLET | Freq: Two times a day (BID) | ORAL | Status: DC

## 2016-01-21 MED ORDER — LIDOCAINE 5 % EX PTCH: 1.0000 | MEDICATED_PATCH | CUTANEOUS | Status: DC

## 2016-01-21 MED ORDER — KETOROLAC TROMETHAMINE 60 MG/2ML IM SOLN
60.0000 mg | Freq: Once | INTRAMUSCULAR | Status: AC
  Administered 2016-01-21: 60 mg via INTRAMUSCULAR
  Filled 2016-01-21: qty 2

## 2016-01-21 MED ORDER — OXYCODONE-ACETAMINOPHEN 5-325 MG PO TABS
1.0000 | ORAL_TABLET | Freq: Once | ORAL | Status: AC
  Administered 2016-01-21: 1 via ORAL
  Filled 2016-01-21: qty 1

## 2016-01-21 NOTE — Discharge Instructions (Signed)
You have been seen today for back pain. Follow up with PCP as needed if symptoms continue. Return to ED should symptoms worsen. Do not drive or do other dangerous activities while using Percocet or Robaxin.  RESOURCE GUIDE  Chronic Pain Problems: Contact [facility]  [phone] Patients need to be referred by their primary care doctor.  Insufficient Money for Medicine: Contact United Way:  call "211" or [facility] [phone].  No Primary Care Doctor: - Call [facility] Connect  [phone] - can help you locate a primary care doctor that  accepts your insurance, provides certain services, etc. - Physician Referral Service- [phone]  Agencies that provide inexpensive medical care: - [facility]  [phone] - [facility]  [phone] - [facility]  [phone] - Women's Clinic  [phone] - Planned Parenthood  [phone] Haynes Bast- [facility]  [phone]  Medicaid-accepting [facility] [place] Providers: - [facility]- [address], [address]  [phone], Mon-Fri 9am-7pm, Sat 9am-1pm - [facility]- [address], [address] [place]  [phone] - [facility]- [address], [address] [place]  [phone] [facility]- [address]  [phone] - Renaye RakersVeita Bland- [address], [address], [phone]  Only accepts [place] patients after they have their name  applied to their card  Self Pay (no insurance) in [place]: - Sickle Cell Patients: Dr Willey BladeEric Dean, [facility]  [address], [phone] - [facility]- [address]  [phone]       Redge Gainer-     [facility] [place]- [address], [address]       -     [facility]- see information above (Speak to CitigroupPam H if you do not have insurance)       -  [facility]- [address], [phone]       -  [facility]- [address],  [phone]       -  [facility]- [address] [place] [address], [phone]       -  Dr Julio Sickssei-Bonsu-  [address], [address], [place], [phone]       -  [facility]- [address], [phone]       -  [facility] [place]- [address], [phone], also [address], [phone]       -    [facility]- [address], [phone], 1st & 3rd Saturday   every month, 10am-1pm  1) Find a Doctor and Pay Out of Pocket Although you won't have to find out who is covered by your insurance plan, it is a good idea to ask around and get recommendations. You will then need to call the office and see if the doctor you have chosen will accept you as a new patient and what types of options they offer for patients who are self-pay. Some doctors offer discounts or will set up payment plans for their patients who do not have insurance, but you will need to ask so you aren't surprised when you get to your appointment.  2) Contact Your Local Health Department Not all health departments have doctors that can see patients for sick visits, but many do, so it is worth a call to see if yours does.  If you don't know where your local health department is, you can check in your phone book. The CDC also has a tool to help you locate your state's health department, and many state websites also have listings of all of their local health departments.  3) Find a Walk-in Clinic If your illness is not likely to be very severe or complicated, you may want to try a walk in clinic. These are popping up all over the country in pharmacies, drugstores, and shopping centers. They're usually staffed by nurse practitioners or physician assistants that have been trained to treat common illnesses and complaints. They're usually fairly quick and inexpensive. However, if you have serious medical issues or chronic medical problems, these are probably not your best option  STD  Testing - [facility] [place], STD Clinic, [address], [place], phone [phone] or [phone].  Monday - Friday, call for an appointment. [facility] [employer], STD Clinic, [address], [place], phone [phone] or [phone].  Monday - Friday, call for an appointment.  Abuse/Neglect: [facility] Child Abuse Hotline 747-535-1684 [facility] Child Abuse Hotline (475) 121-5152 (After Hours)  Emergency Shelter:  [facility] 4424320491  Maternity Homes: - [facility] [place] [facility] [phone] - [facility] [phone]  MRSA Hotline #:   (530) 397-3128  [facility]  [facility] [place]  United [facility] Dept. [address]                 [address]         [address] [place] [address]  Blondell Reveal Phone:  [phone]                                  Phone:  [phone]                   Phone:  [phone]  [facility], [phone] - [phone]       -     [facility] in [place], [address]                                  [phone], [facility] Child Abuse Hotline (902)028-4206 or [phone] (After Hours)   Behavioral Health Services  Substance Abuse Resources: - Alcohol and Drug Services  [phone] - [facility] [phone] - [facility] [phone] Floydene Flock [phone] - Residential & Outpatient Substance Abuse Program  [phone]  Psychological Services: [facility]  [phone] Services  [phone] - [facility], [phone] [place]. [address] [place], ACCESS LINE: 5416462694 or [phone],  EntrepreneurLoan.co.za  Dental Assistance  If unable to pay or uninsured, contact:  [facility] or [facility] to become qualified for the adult dental clinic.  Patients with Medicaid: [facility] [phone], [phone] [address], [phone]  If unable to pay, or uninsured, contact [facility] [phone]) or [facility] [phone] in [place], [phone] in [facility] [place]) to become qualified for the adult dental clinic   Other Low-Cost Community Dental Services: - [facility]- [address], [place], [place], [phone], [phone], [address]. [phone], 2nd and 4th Thursday of the month at 6:30am.  10 clients each day by appointment, can sometimes see walk-in patients if someone does not show for an appointment. [facility]- [address], [place], [place], [phone], [phone] - [facility]- [address], [place], [place], [address], [phone] - [facility] [facility]- [phone] [facility]- [phone] [facility]- [phone]

## 2016-01-21 NOTE — ED Provider Notes (Signed)
CSN: [id number]     Arrival date & time 01/21/16  2139 History  By signing my name below, I, Kenneth Arellano, attest that this documentation has been prepared under the direction and in the presence of Shawn Joy, PA-C. Electronically Signed: Doreatha Arellano, ED Scribe. 01/21/2016. 10:17 PM.    Chief Complaint  Patient presents with  . Back Pain   The history is provided by the patient. No language interpreter was used.   HPI Comments: Kenneth Arellano Reason is a 35 y.o. male who presents to the Emergency Department complaining of moderate, sharp 8/10 right lower back pain onset yesterday. Pt states he does a lot of heavy lifting at work and attributes his pain to this factor. Denies recent falls, trauma, injury, bending, twisting, MVC. He reports that pain is worsened with movement and in certain positions. He is ambulatory without difficulty. Pt states he has applied a topical ointment with no relief of pain. Pt denies taking OTC medications at home to improve symptoms. No h/o cancer, IVDU, back surgery, back injury. Denies pain radiation to legs, bowel or bladder incontinence, saddle anesthesia, fever, cough, abdominal pain, dysuria, hematuria, frequency, rash. Denies numbness, focal weakness or paresthesia of the lower extremities.    History reviewed. No pertinent past medical history. History reviewed. No pertinent past surgical history. No family history on file. Social History  Substance Use Topics  . Smoking status: Current Every Day Smoker -- 1.00 packs/day    Types: Cigarettes  . Smokeless tobacco: Never Used  . Alcohol Use: No     Comment: social    Review of Systems  Constitutional: Negative for fever and chills.  Respiratory: Negative for cough.   Gastrointestinal: Negative for nausea, vomiting and abdominal pain.  Genitourinary: Negative for dysuria, frequency and hematuria.  Musculoskeletal: Positive for back pain ( right lower).  Skin: Negative for rash.  Neurological: Negative for  weakness and numbness.    Allergies  Review of patient's allergies indicates no known allergies.  Home Medications   Prior to Admission medications   Medication Sig Start Date End Date Taking? Authorizing Provider  lidocaine (LIDODERM) 5 % Place 1 patch onto the skin daily. Remove & Discard patch within 12 hours or as directed by MD 01/21/16   Anselm Pancoast, PA-C  methocarbamol (ROBAXIN) 500 MG tablet Take 1 tablet (500 mg total) by mouth 2 (two) times daily. 01/21/16   Shawn C Joy, PA-C  naproxen (NAPROSYN) 500 MG tablet Take 1 tablet (500 mg total) by mouth 2 (two) times daily. 01/21/16   Shawn C Joy, PA-C  oxyCODONE-acetaminophen (PERCOCET/ROXICET) 5-325 MG tablet Take 2 tablets by mouth every 6 (six) hours as needed for severe pain. 09/15/15   Corena Herter, MD  oxyCODONE-acetaminophen (PERCOCET/ROXICET) 5-325 MG tablet Take 1 tablet by mouth every 4 (four) hours as needed for severe pain. 01/21/16   Shawn C Joy, PA-C   BP 114/83 mmHg  Pulse 79  Temp(Src) 98.1 F (36.7 C) (Oral)  Resp 18  SpO2 98% Physical Exam  Constitutional: He is oriented to person, place, and time. He appears well-developed and well-nourished.  HENT:  Head: Normocephalic and atraumatic.  Eyes: Conjunctivae and EOM are normal. Pupils are equal, round, and reactive to light.  Neck: Normal range of motion. Neck supple.  Cardiovascular: Normal rate.   Pulmonary/Chest: Effort normal. No respiratory distress.  Abdominal: He exhibits no distension.  Musculoskeletal: Normal range of motion. He exhibits tenderness.  Tenderness in the right lumbar musculature. No paraspinal tenderness.  Neurological: He is alert and oriented to person, place, and time.  Strength and sensation equal and intact bilaterally throughout the upper and lower extremities.Normal gait.   Skin: Skin is warm and dry.  Psychiatric: He has a normal mood and affect. His behavior is normal.  Nursing note and vitals reviewed.   ED Course   Procedures (including critical care time) DIAGNOSTIC STUDIES: Oxygen Saturation is 98% on RA, normal by my interpretation.    COORDINATION OF CARE: 10:13 PM Discussed treatment plan with pt at bedside which includes antiinflammatory, muscle relaxer and pt agreed to plan.   MDM   Final diagnoses:  Right-sided low back pain without sciatica    Jeralyn BennettCedric Riecke presents to the ED for evaluation of right lower back pain. No neurological or functional deficits.  Patient is ambulatory.  No loss of bowel or bladder control.  No evidence of cauda equina. No red flags. No fever, night sweats, weight loss, h/o cancer, IVDA, no recent procedure to back. No urinary symptoms suggestive of UTI.  Suspect muscle strain. Patient will be sent home with percocet, naprosyn, Lidoderm, robaxin. Conservative therapies discussed and recommended. Patient advised to follow up with PCP as needed, or with worsening symptoms. Patient appears stable for discharge at this time. Return precautions discussed and outlined in discharge paperwork. Patient is agreeable to plan.    Filed Vitals:   01/21/16 2152  BP: 114/83  Pulse: 79  Temp: 98.1 F (36.7 C)  Resp: 18     I personally performed the services described in this documentation, which was scribed in my presence. The recorded information has been reviewed and is accurate.   Anselm PancoastShawn C Joy, PA-C 01/21/16 2231  Pricilla LovelessScott Goldston, MD 01/26/16 1135

## 2016-01-21 NOTE — ED Notes (Signed)
Pt is here with lower back pain.  Non radiating,  No numbness, weakness or incontinence with this.  Pt has to lift heavy items for work and thinks he hurt his back yesterday.

## 2016-03-27 ENCOUNTER — Emergency Department (HOSPITAL_COMMUNITY): Payer: Self-pay

## 2016-03-27 ENCOUNTER — Encounter (HOSPITAL_COMMUNITY): Payer: Self-pay

## 2016-03-27 DIAGNOSIS — F17210 Nicotine dependence, cigarettes, uncomplicated: Secondary | ICD-10-CM | POA: Insufficient documentation

## 2016-03-27 DIAGNOSIS — R079 Chest pain, unspecified: Secondary | ICD-10-CM | POA: Insufficient documentation

## 2016-03-27 LAB — CBC
HCT: 44.4 % (ref 39.0–52.0)
HEMOGLOBIN: 15 g/dL (ref 13.0–17.0)
MCH: 30.2 pg (ref 26.0–34.0)
MCHC: 33.8 g/dL (ref 30.0–36.0)
MCV: 89.5 fL (ref 78.0–100.0)
Platelets: 221 10*3/uL (ref 150–400)
RBC: 4.96 MIL/uL (ref 4.22–5.81)
RDW: 13.1 % (ref 11.5–15.5)
WBC: 8.7 10*3/uL (ref 4.0–10.5)

## 2016-03-27 LAB — BASIC METABOLIC PANEL
ANION GAP: 5 (ref 5–15)
BUN: 12 mg/dL (ref 6–20)
CALCIUM: 9.5 mg/dL (ref 8.9–10.3)
CO2: 30 mmol/L (ref 22–32)
Chloride: 102 mmol/L (ref 101–111)
Creatinine, Ser: 1.65 mg/dL — ABNORMAL HIGH (ref 0.61–1.24)
GFR, EST NON AFRICAN AMERICAN: 53 mL/min — AB (ref >60)
Glucose, Bld: 111 mg/dL — ABNORMAL HIGH (ref 65–99)
Potassium: 4.2 mmol/L (ref 3.5–5.1)
Sodium: 137 mmol/L (ref 135–145)

## 2016-03-27 LAB — I-STAT TROPONIN, ED: TROPONIN I, POC: 0 ng/mL (ref 0.00–0.08)

## 2016-03-27 NOTE — ED Notes (Signed)
Patient complains of right sided chest pain with radiation to central chest x 1 hour. Pain worse with inspiration, no other associated symptoms. No cold or cough symptoms

## 2016-03-28 ENCOUNTER — Emergency Department (HOSPITAL_COMMUNITY)
Admission: EM | Admit: 2016-03-28 | Discharge: 2016-03-28 | Disposition: A | Discharge status: Home / Self Care | Payer: Self-pay | Attending: Emergency Medicine | Admitting: Emergency Medicine

## 2016-03-28 NOTE — ED Notes (Signed)
See note in chart from Dr. Mora Bellmanni

## 2016-03-28 NOTE — ED Notes (Signed)
Pt called again to go to room A3 but no answer in waiting room.

## 2016-03-28 NOTE — ED Provider Notes (Signed)
12:34 AM I was informed patient eloped from the emergency department waiting room at this time. His chest x-ray had showed a small apical pneumothorax. I called the patient with the phone number provided in his chart on 2 occasions and left a voicemail giving return precautions and advised for primary care follow-up.  Tomasita CrumbleAdeleke Laina Guerrieri, MD 03/28/16 [phone]

## 2016-03-28 NOTE — ED Notes (Signed)
Pt placed in B14. Pt was called in waiting room but no answer. Pt placed back in waiting room.

## 2016-03-28 NOTE — ED Notes (Signed)
Per tech first pt is not answering in waiting room.  Notified Dr. Mora Bellmanni to see if pt needs to be called to return.  Dr. Mora Bellmanni is going to call pt himself.

## 2016-12-04 ENCOUNTER — Encounter (HOSPITAL_COMMUNITY): Payer: Self-pay | Admitting: *Deleted

## 2016-12-04 ENCOUNTER — Emergency Department (HOSPITAL_COMMUNITY)
Admission: EM | Admit: 2016-12-04 | Discharge: 2016-12-04 | Disposition: A | Discharge status: Home / Self Care | Payer: BLUE CROSS/BLUE SHIELD | Attending: Emergency Medicine | Admitting: Emergency Medicine

## 2016-12-04 DIAGNOSIS — F17210 Nicotine dependence, cigarettes, uncomplicated: Secondary | ICD-10-CM | POA: Insufficient documentation

## 2016-12-04 DIAGNOSIS — R112 Nausea with vomiting, unspecified: Secondary | ICD-10-CM

## 2016-12-04 DIAGNOSIS — R197 Diarrhea, unspecified: Secondary | ICD-10-CM | POA: Insufficient documentation

## 2016-12-04 LAB — I-STAT CHEM 8, ED
BUN: 10 mg/dL (ref 6–20)
CALCIUM ION: 1.11 mmol/L — AB (ref 1.15–1.40)
CHLORIDE: 103 mmol/L (ref 101–111)
Creatinine, Ser: 1.1 mg/dL (ref 0.61–1.24)
GLUCOSE: 99 mg/dL (ref 65–99)
HCT: 49 % (ref 39.0–52.0)
Hemoglobin: 16.7 g/dL (ref 13.0–17.0)
POTASSIUM: 3.6 mmol/L (ref 3.5–5.1)
Sodium: 140 mmol/L (ref 135–145)
TCO2: 24 mmol/L (ref 0–100)

## 2016-12-04 MED ORDER — ONDANSETRON 4 MG PO TBDP
8.0000 mg | ORAL_TABLET | Freq: Once | ORAL | Status: AC
  Administered 2016-12-04: 8 mg via ORAL

## 2016-12-04 MED ORDER — ONDANSETRON HCL 4 MG PO TABS: 4.0000 mg | ORAL_TABLET | Freq: Four times a day (QID) | ORAL | 0 refills | Status: DC

## 2016-12-04 MED ORDER — ONDANSETRON 4 MG PO TBDP
ORAL_TABLET | ORAL | Status: AC
  Filled 2016-12-04: qty 2

## 2016-12-04 NOTE — ED Provider Notes (Signed)
[facility]-EMERGENCY DEPT Provider Note   CSN: [id number] Arrival date & time: 12/04/16  2035     History   Chief Complaint Chief Complaint  Patient presents with  . Diarrhea  . Emesis    HPI Kenneth Arellano is a 36 y.o. male.  Patient presents emergency department with chief complaint of nausea, vomiting, diarrhea. He states that the symptoms started this morning. He reports that his significant other has been sick with the same. He denies any fevers or chills. He denies any other associated symptoms. He feels improved with Zofran given in triage. He denies any focal abdominal pain.   The history is provided by the patient. No language interpreter was used.    History reviewed. No pertinent past medical history.  There are no active problems to display for this patient.   History reviewed. No pertinent surgical history.     Home Medications    Prior to Admission medications   Medication Sig Start Date End Date Taking? Authorizing Provider  lidocaine (LIDODERM) 5 % Place 1 patch onto the skin daily. Remove & Discard patch within 12 hours or as directed by MD 01/21/16   Anselm PancoastShawn C Joy, PA-C  methocarbamol (ROBAXIN) 500 MG tablet Take 1 tablet (500 mg total) by mouth 2 (two) times daily. 01/21/16   Shawn C Joy, PA-C  naproxen (NAPROSYN) 500 MG tablet Take 1 tablet (500 mg total) by mouth 2 (two) times daily. 01/21/16   Shawn C Joy, PA-C  ondansetron (ZOFRAN) 4 MG tablet Take 1 tablet (4 mg total) by mouth every 6 (six) hours. 12/04/16   Roxy Horsemanobert Raniyah Curenton, PA-C  oxyCODONE-acetaminophen (PERCOCET/ROXICET) 5-325 MG tablet Take 2 tablets by mouth every 6 (six) hours as needed for severe pain. 09/15/15   Corena HerterShannon Mumma, MD  oxyCODONE-acetaminophen (PERCOCET/ROXICET) 5-325 MG tablet Take 1 tablet by mouth every 4 (four) hours as needed for severe pain. 01/21/16   Anselm PancoastShawn C Joy, PA-C    Family History No family history on file.  Social History Social History  Substance Use Topics  . Smoking  status: Current Every Day Smoker    Packs/day: 1.00    Types: Cigarettes  . Smokeless tobacco: Never Used  . Alcohol use No     Comment: social     Allergies   Patient has no known allergies.   Review of Systems Review of Systems  All other systems reviewed and are negative.    Physical Exam Updated Vital Signs BP 123/78 (BP Location: Left Arm)   Pulse 86   Temp 98.7 F (37.1 C) (Oral)   Resp 16   Ht 6\' 1"  (1.854 m)   Wt 91.2 kg   SpO2 99%   BMI 26.52 kg/m   Physical Exam  Constitutional: He is oriented to person, place, and time. He appears well-developed and well-nourished.  HENT:  Head: Normocephalic and atraumatic.  Eyes: Conjunctivae and EOM are normal. Pupils are equal, round, and reactive to light. Right eye exhibits no discharge. Left eye exhibits no discharge. No scleral icterus.  Neck: Normal range of motion. Neck supple. No JVD present.  Cardiovascular: Normal rate, regular rhythm and normal heart sounds.  Exam reveals no gallop and no friction rub.   No murmur heard. Pulmonary/Chest: Effort normal and breath sounds normal. No respiratory distress. He has no wheezes. He has no rales. He exhibits no tenderness.  Abdominal: Soft. He exhibits no distension and no mass. There is no tenderness. There is no rebound and no guarding.  No focal abdominal  tenderness, no RLQ tenderness or pain at McBurney's point, no RUQ tenderness or Murphy's sign, no left-sided abdominal tenderness, no fluid wave, or signs of peritonitis   Musculoskeletal: Normal range of motion. He exhibits no edema or tenderness.  Neurological: He is alert and oriented to person, place, and time.  Skin: Skin is warm and dry.  Psychiatric: He has a normal mood and affect. His behavior is normal. Judgment and thought content normal.  Nursing note and vitals reviewed.    ED Treatments / Results  Labs (all labs ordered are listed, but only abnormal results are displayed) Labs Reviewed  I-STAT  CHEM 8, ED - Abnormal; Notable for the following:       Result Value   Calcium, Ion 1.11 (*)    All other components within normal limits    EKG  EKG Interpretation None       Radiology No results found.  Procedures Procedures (including critical care time)  Medications Ordered in ED Medications  ondansetron (ZOFRAN-ODT) disintegrating tablet 8 mg (8 mg Oral Given 12/04/16 2056)     Initial Impression / Assessment and Plan / ED Course  I have reviewed the triage vital signs and the nursing notes.  Pertinent labs & imaging results that were available during my care of the patient were reviewed by me and considered in my medical decision making (see chart for details).     Patient with nausea, vomiting, and diarrhea. Sudden onset this morning. Significant other is sick with the same. No focal abdominal tenderness on exam. Vital signs are stable. Improved with Zofran in triage. Will discharge home with Zofran. Suspect viral illness.  Final Clinical Impressions(s) / ED Diagnoses   Final diagnoses:  Nausea vomiting and diarrhea    New Prescriptions New Prescriptions   ONDANSETRON (ZOFRAN) 4 MG TABLET    Take 1 tablet (4 mg total) by mouth every 6 (six) hours.     Roxy Horseman, PA-C 12/04/16 2225    Rolland Porter, MD 12/11/16 [phone]

## 2016-12-04 NOTE — ED Notes (Signed)
Gave pt ice chips to start taking po

## 2016-12-04 NOTE — ED Notes (Signed)
ED Provider at bedside. 

## 2016-12-04 NOTE — ED Triage Notes (Signed)
Pt here for n/v/d since this am.  Significant other has same.

## 2017-01-13 ENCOUNTER — Emergency Department (HOSPITAL_COMMUNITY)
Admission: EM | Admit: 2017-01-13 | Discharge: 2017-01-13 | Disposition: A | Discharge status: Home / Self Care | Payer: BLUE CROSS/BLUE SHIELD | Attending: Emergency Medicine | Admitting: Emergency Medicine

## 2017-01-13 ENCOUNTER — Encounter (HOSPITAL_COMMUNITY): Payer: Self-pay | Admitting: Emergency Medicine

## 2017-01-13 DIAGNOSIS — R059 Cough, unspecified: Secondary | ICD-10-CM

## 2017-01-13 DIAGNOSIS — F17210 Nicotine dependence, cigarettes, uncomplicated: Secondary | ICD-10-CM | POA: Insufficient documentation

## 2017-01-13 DIAGNOSIS — M545 Low back pain: Secondary | ICD-10-CM | POA: Insufficient documentation

## 2017-01-13 DIAGNOSIS — Z79899 Other long term (current) drug therapy: Secondary | ICD-10-CM | POA: Insufficient documentation

## 2017-01-13 DIAGNOSIS — T148XXA Other injury of unspecified body region, initial encounter: Secondary | ICD-10-CM

## 2017-01-13 DIAGNOSIS — Z720 Tobacco use: Secondary | ICD-10-CM

## 2017-01-13 DIAGNOSIS — R05 Cough: Secondary | ICD-10-CM | POA: Insufficient documentation

## 2017-01-13 DIAGNOSIS — G8929 Other chronic pain: Secondary | ICD-10-CM | POA: Insufficient documentation

## 2017-01-13 DIAGNOSIS — M62830 Muscle spasm of back: Secondary | ICD-10-CM

## 2017-01-13 MED ORDER — CYCLOBENZAPRINE HCL 10 MG PO TABS: 10.0000 mg | ORAL_TABLET | Freq: Three times a day (TID) | ORAL | 0 refills | Status: DC | PRN

## 2017-01-13 MED ORDER — NAPROXEN 500 MG PO TABS: 500.0000 mg | ORAL_TABLET | Freq: Two times a day (BID) | ORAL | 0 refills | Status: DC | PRN

## 2017-01-13 NOTE — ED Provider Notes (Signed)
[facility]-EMERGENCY DEPT Provider Note   CSN: [id number] Arrival date & time: 01/13/17  2021     History   Chief Complaint Chief Complaint  Patient presents with  . Back Pain    HPI Kenneth Arellano is a 36 y.o. male with a PMHx of chronic low back pain, who presents to the ED with complaints of lower back pain that began yesterday after he was doing some heavy lifting at work. Patient states that it feels like a pulled muscle. He describes the pain is 10/10 constant sore aching and occasionally sharp nonradiating lower back pain, worse with movement, and mildly improved with hot compress, with no other tx tried PTA. He states he's had similar issues in the past related to his heavy lifting at work. He also mentions that he's had an intermittent dry cough for the last 2-3 months, which comes and goes, and wonders why he keeps having a cough. He admits to being an every day cigarette smoker. He also reports that he has seasonal allergies and thinks that this could potentially be a cause of his cough. He denies any fevers, chills, CP, SOB, hemoptysis, abd pain, N/V/D/C, hematuria, dysuria, incontinence of urine/stool, saddle anesthesia/cauda equina symptoms, myalgias, arthralgias, numbness, tingling, focal weakness, or any other complaints at this time. Denies hx of CA or IVDU.    The history is provided by the patient and medical records. No language interpreter was used.  Back Pain   This is a recurrent problem. The current episode started yesterday. The problem occurs constantly. The problem has not changed since onset.The pain is associated with lifting heavy objects. The pain is present in the lumbar spine. The quality of the pain is described as aching. The pain does not radiate. The pain is at a severity of 10/10. The pain is severe. Exacerbated by: movement. The pain is the same all the time. Pertinent negatives include no chest pain, no fever, no numbness, no abdominal pain, no bowel  incontinence, no perianal numbness, no bladder incontinence, no dysuria, no paresthesias, no paresis, no tingling and no weakness. He has tried heat for the symptoms. The treatment provided moderate relief.    History reviewed. No pertinent past medical history.  There are no active problems to display for this patient.   Past Surgical History:  Procedure Laterality Date  . CHEST TUBE INSERTION         Home Medications    Prior to Admission medications   Medication Sig Start Date End Date Taking? Authorizing Provider  lidocaine (LIDODERM) 5 % Place 1 patch onto the skin daily. Remove & Discard patch within 12 hours or as directed by MD 01/21/16   Anselm Pancoast, PA-C  methocarbamol (ROBAXIN) 500 MG tablet Take 1 tablet (500 mg total) by mouth 2 (two) times daily. 01/21/16   Shawn C Joy, PA-C  naproxen (NAPROSYN) 500 MG tablet Take 1 tablet (500 mg total) by mouth 2 (two) times daily. 01/21/16   Shawn C Joy, PA-C  ondansetron (ZOFRAN) 4 MG tablet Take 1 tablet (4 mg total) by mouth every 6 (six) hours. 12/04/16   Roxy Horseman, PA-C  oxyCODONE-acetaminophen (PERCOCET/ROXICET) 5-325 MG tablet Take 2 tablets by mouth every 6 (six) hours as needed for severe pain. 09/15/15   Corena Herter, MD  oxyCODONE-acetaminophen (PERCOCET/ROXICET) 5-325 MG tablet Take 1 tablet by mouth every 4 (four) hours as needed for severe pain. 01/21/16   Anselm Pancoast, PA-C    Family History Family History  Problem Relation  Age of Onset  . Hypertension Other     Social History Social History  Substance Use Topics  . Smoking status: Current Every Day Smoker    Packs/day: 0.50    Types: Cigarettes  . Smokeless tobacco: Never Used  . Alcohol use No     Comment: social     Allergies   Patient has no known allergies.   Review of Systems Review of Systems  Constitutional: Negative for chills and fever.  Respiratory: Positive for cough (intermittent x2-3 months). Negative for shortness of breath.     Cardiovascular: Negative for chest pain.  Gastrointestinal: Negative for abdominal pain, bowel incontinence, constipation, diarrhea, nausea and vomiting.  Genitourinary: Negative for bladder incontinence, difficulty urinating (no incontinence), dysuria and hematuria.  Musculoskeletal: Positive for back pain. Negative for arthralgias and myalgias.  Skin: Negative for color change.  Allergic/Immunologic: Positive for environmental allergies. Negative for immunocompromised state.  Neurological: Negative for tingling, weakness, numbness and paresthesias.  Psychiatric/Behavioral: Negative for confusion.   10 Systems reviewed and are negative for acute change except as noted in the HPI.   Physical Exam Updated Vital Signs BP 121/79 (BP Location: Left Arm)   Pulse 70   Temp 98.1 F (36.7 C) (Oral)   Resp 20   SpO2 96%   Physical Exam  Constitutional: He is oriented to person, place, and time. Vital signs are normal. He appears well-developed and well-nourished.  Non-toxic appearance. No distress.  Afebrile, nontoxic, NAD  HENT:  Head: Normocephalic and atraumatic.  Mouth/Throat: Oropharynx is clear and moist and mucous membranes are normal.  Eyes: Conjunctivae and EOM are normal. Right eye exhibits no discharge. Left eye exhibits no discharge.  Neck: Normal range of motion. Neck supple.  Cardiovascular: Normal rate, regular rhythm, normal heart sounds and intact distal pulses.  Exam reveals no gallop and no friction rub.   No murmur heard. Pulmonary/Chest: Effort normal and breath sounds normal. No respiratory distress. He has no decreased breath sounds. He has no wheezes. He has no rhonchi. He has no rales.  CTAB in all lung fields, no w/r/r, no hypoxia or increased WOB, speaking in full sentences, SpO2 96% on RA   Abdominal: Soft. Normal appearance and bowel sounds are normal. He exhibits no distension. There is no tenderness. There is no rigidity, no rebound, no guarding and no CVA  tenderness.  Musculoskeletal: Normal range of motion.       Lumbar back: He exhibits tenderness and spasm. He exhibits normal range of motion, no bony tenderness, no swelling and no deformity.       Back:  Lumbar spine with FROM intact without spinous process TTP, no bony stepoffs or deformities, with mild diffuse bilateral paraspinous muscle TTP and palpable muscle spasms. Strength and sensation grossly intact in all extremities, negative SLR bilaterally, gait steady and nonantalgic. No overlying skin changes. Distal pulses intact.   Neurological: He is alert and oriented to person, place, and time. He has normal strength. No sensory deficit.  Skin: Skin is warm, dry and intact. No rash noted.  Psychiatric: He has a normal mood and affect.  Nursing note and vitals reviewed.    ED Treatments / Results  Labs (all labs ordered are listed, but only abnormal results are displayed) Labs Reviewed - No data to display  EKG  EKG Interpretation None       Radiology No results found.  Procedures Procedures (including critical care time)  Medications Ordered in ED Medications - No data to display  Initial Impression / Assessment and Plan / ED Course  I have reviewed the triage vital signs and the nursing notes.  Pertinent labs & imaging results that were available during my care of the patient were reviewed by me and considered in my medical decision making (see chart for details).     36 y.o. male here with acute on chronic low back pain after doing some heavy lifting at work; hx of similar issues in the past, states it feels like a pulled muscle. No red flag s/s of low back pain. No s/s of central cord compression or cauda equina. Lower extremities are neurovascularly intact and patient is ambulating without difficulty. Tenderness diffusely in paraspinous muscles with palpable spasms. Doubt need for imaging/labs, likely muscular strain/spasm. Pt also reports intermittent cough x2-3  months, reports seasonal allergies which is likely the cause of his symptoms; also reports being a smoker, which is likely contributory; lung sounds clear, doubt need for CXR at this time, advised antihistamines, mucinex, OTC remedies, and smoking cessation.   Patient was counseled on back pain precautions and told to do activity as tolerated but do not lift, push, or pull heavy objects more than 10 pounds for the next week. Patient counseled to use ice or heat on back for no longer than 15 minutes every hour.   Rx given for muscle relaxer and counseled on proper use of muscle relaxant medication. Urged patient not to drink alcohol, drive, or perform any other activities that requires focus while taking this medication. Rx for naprosyn given. Advised use of tylenol as well.   Patient urged to follow-up with PCP if pain does not improve with treatment and rest or if pain becomes recurrent. Urged to return with worsening severe pain, loss of bowel or bladder control, trouble walking. The patient verbalizes understanding and agrees with the plan.   Final Clinical Impressions(s) / ED Diagnoses   Final diagnoses:  Chronic bilateral low back pain without sciatica  Muscle spasm of back  Muscle strain  Tobacco user  Cough    New Prescriptions New Prescriptions   CYCLOBENZAPRINE (FLEXERIL) 10 MG TABLET    Take 1 tablet (10 mg total) by mouth 3 (three) times daily as needed for muscle spasms.   NAPROXEN (NAPROSYN) 500 MG TABLET    Take 1 tablet (500 mg total) by mouth 2 (two) times daily as needed for mild pain, moderate pain or headache (TAKE WITH MEALS.).     [address]Mercedes Achol Azpeitia, PA-C 01/13/17 2106    Lavera Guiseana Duo Liu, MD 01/14/17 [phone]

## 2017-01-13 NOTE — Discharge Instructions (Signed)
For your cough: Use Mucinex for cough suppression/expectoration of mucus. May consider over-the-counter Benadryl or other antihistamine to decrease secretions and for help with your symptoms. STOP SMOKING! Follow up with your primary care doctor in 5-7 days for recheck of ongoing symptoms. Return to emergency department for emergent changing or worsening of symptoms.   For your Back Pain: Your back pain should be treated with medicines such as ibuprofen or aleve and this back pain should get better over the next 2 weeks.  However if you develop severe or worsening pain, low back pain with fever, numbness, weakness or inability to walk or urinate, you should return to the ER immediately.  Please follow up with your doctor this week for a recheck if still having symptoms.  Avoid heavy lifting over 10 pounds over the next two weeks.  Low back pain is discomfort in the lower back that may be due to injuries to muscles and ligaments around the spine.  Occasionally, it may be caused by a a problem to a part of the spine called a disc.  The pain may last several days or a week;  However, most patients get completely well in 4 weeks.  Self - care:  The application of heat can help soothe the pain.  Maintaining your daily activities, including walking, is encourged, as it will help you get better faster than just staying in bed. Perform gentle stretching as discussed. Drink plenty of fluids.  Medications are also useful to help with pain control.  A commonly prescribed medication includes tylenol; use over the counter tylenol as needed for pain relief.  Non steroidal anti inflammatory medications including Ibuprofen and naproxen;  These medications help both pain and swelling and are very useful in treating back pain.  They should be taken with food, as they can cause stomach upset, and more seriously, stomach bleeding.    Muscle relaxants:  These medications can help with muscle tightness that is a cause of  lower back pain.  Most of these medications can cause drowsiness, and it is not safe to drive or use dangerous machinery while taking them.  SEEK IMMEDIATE MEDICAL ATTENTION IF: New numbness, tingling, weakness, or problem with the use of your arms or legs.  Severe back pain not relieved with medications.  Difficulty with or loss of control of your bowel or bladder control.  Increasing pain in any areas of the body (such as chest or abdominal pain).  Shortness of breath, dizziness or fainting.  Nausea (feeling sick to your stomach), vomiting, fever, or sweats.  You will need to follow up with  Your primary healthcare provider in 1-2 weeks for reassessment.

## 2017-01-13 NOTE — ED Triage Notes (Signed)
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

## 2017-04-04 ENCOUNTER — Ambulatory Visit (HOSPITAL_COMMUNITY)
Admission: EM | Admit: 2017-04-04 | Discharge: 2017-04-04 | Disposition: A | Discharge status: Home / Self Care | Payer: BLUE CROSS/BLUE SHIELD

## 2017-04-04 ENCOUNTER — Encounter (HOSPITAL_COMMUNITY): Payer: Self-pay | Admitting: Emergency Medicine

## 2017-04-04 DIAGNOSIS — K029 Dental caries, unspecified: Secondary | ICD-10-CM

## 2017-04-04 DIAGNOSIS — K0889 Other specified disorders of teeth and supporting structures: Secondary | ICD-10-CM | POA: Diagnosis not present

## 2017-04-04 MED ORDER — ACETAMINOPHEN 325 MG PO TABS
975.0000 mg | ORAL_TABLET | Freq: Once | ORAL | Status: AC
  Administered 2017-04-04: 975 mg via ORAL

## 2017-04-04 MED ORDER — AMOXICILLIN 875 MG PO TABS: 875.0000 mg | ORAL_TABLET | Freq: Two times a day (BID) | ORAL | 0 refills | Status: DC

## 2017-04-04 MED ORDER — KETOROLAC TROMETHAMINE 60 MG/2ML IM SOLN
INTRAMUSCULAR | Status: AC
  Filled 2017-04-04: qty 2

## 2017-04-04 MED ORDER — ACETAMINOPHEN 325 MG PO TABS
ORAL_TABLET | ORAL | Status: AC
  Filled 2017-04-04: qty 3

## 2017-04-04 MED ORDER — KETOROLAC TROMETHAMINE 60 MG/2ML IM SOLN
60.0000 mg | Freq: Once | INTRAMUSCULAR | Status: AC
  Administered 2017-04-04: 60 mg via INTRAMUSCULAR

## 2017-04-04 MED ORDER — IBUPROFEN 800 MG PO TABS
800.0000 mg | ORAL_TABLET | Freq: Three times a day (TID) | ORAL | 0 refills | Status: DC
Start: 2017-04-04 — End: 2017-10-09

## 2017-04-04 NOTE — ED Provider Notes (Signed)
CSN: [id number]     Arrival date & time 04/04/17  1438 History   None    Chief Complaint  Patient presents with  . Dental Pain   (Consider location/radiation/quality/duration/timing/severity/associated sxs/prior Treatment) Patient c/o left lower jaw and dental pain.  He is seeing a Education officer, community.   The history is provided by the patient.  Dental Pain  Location:  Lower Lower teeth location:  23/LL lateral incisor Quality:  Aching Severity:  Moderate Onset quality:  Sudden Duration:  2 days Timing:  Constant Progression:  Worsening Chronicity:  New Context: abscess and cap fell off   Relieved by:  Nothing Worsened by:  Nothing Ineffective treatments:  None tried   History reviewed. No pertinent past medical history. Past Surgical History:  Procedure Laterality Date  . CHEST TUBE INSERTION     Family History  Problem Relation Age of Onset  . Hypertension Other    Social History  Substance Use Topics  . Smoking status: Current Every Day Smoker    Packs/day: 0.50    Types: Cigarettes  . Smokeless tobacco: Never Used  . Alcohol use No     Comment: social    Review of Systems  Constitutional: Negative.   HENT: Positive for dental problem.   Eyes: Negative.   Respiratory: Negative.   Cardiovascular: Negative.   Gastrointestinal: Negative.   Endocrine: Negative.   Genitourinary: Negative.   Musculoskeletal: Negative.   Allergic/Immunologic: Negative.   Neurological: Negative.   Psychiatric/Behavioral: Negative.     Allergies  Patient has no known allergies.  Home Medications   Prior to Admission medications   Medication Sig Start Date End Date Taking? Authorizing Provider  ibuprofen (ADVIL,MOTRIN) 200 MG tablet Take 200 mg by mouth every 6 (six) hours as needed.   Yes [provider]  amoxicillin (AMOXIL) 875 MG tablet Take 1 tablet (875 mg total) by mouth 2 (two) times daily. 04/04/17   Deatra Canter, FNP  cyclobenzaprine (FLEXERIL) 10 MG tablet  Take 1 tablet (10 mg total) by mouth 3 (three) times daily as needed for muscle spasms. 01/13/17   Street, [place], PA-C  ibuprofen (ADVIL,MOTRIN) 800 MG tablet Take 1 tablet (800 mg total) by mouth 3 (three) times daily. 04/04/17   Deatra Canter, FNP  lidocaine (LIDODERM) 5 % Place 1 patch onto the skin daily. Remove & Discard patch within 12 hours or as directed by MD 01/21/16   Joy, Shawn C, PA-C  methocarbamol (ROBAXIN) 500 MG tablet Take 1 tablet (500 mg total) by mouth 2 (two) times daily. 01/21/16   Joy, Shawn C, PA-C  naproxen (NAPROSYN) 500 MG tablet Take 1 tablet (500 mg total) by mouth 2 (two) times daily. 01/21/16   Joy, Shawn C, PA-C  naproxen (NAPROSYN) 500 MG tablet Take 1 tablet (500 mg total) by mouth 2 (two) times daily as needed for mild pain, moderate pain or headache (TAKE WITH MEALS.). 01/13/17   Street, [place], PA-C  ondansetron (ZOFRAN) 4 MG tablet Take 1 tablet (4 mg total) by mouth every 6 (six) hours. 12/04/16   Roxy Horseman, PA-C  oxyCODONE-acetaminophen (PERCOCET/ROXICET) 5-325 MG tablet Take 2 tablets by mouth every 6 (six) hours as needed for severe pain. 09/15/15   Corena Herter, MD  oxyCODONE-acetaminophen (PERCOCET/ROXICET) 5-325 MG tablet Take 1 tablet by mouth every 4 (four) hours as needed for severe pain. 01/21/16   Joy, Shawn C, PA-C   Meds Ordered and Administered this Visit   Medications  ketorolac (TORADOL) injection 60 mg (60  mg Intramuscular Given 04/04/17 1532)  acetaminophen (TYLENOL) tablet 975 mg (975 mg Oral Given 04/04/17 1530)    BP (!) 148/84 (BP Location: Right Arm)   Pulse 70   Temp 98.6 F (37 C) (Oral)   Resp 20   SpO2 96%  No data found.   Physical Exam  Constitutional: He appears well-developed and well-nourished.  HENT:  Head: Normocephalic and atraumatic.  Right Ear: External ear normal.  Left Ear: External ear normal.  Mouth/Throat: Oropharynx is clear and moist.  Left lower molar with crack and missing cap and  tenderness.  Eyes: Conjunctivae and EOM are normal. Pupils are equal, round, and reactive to light.  Neck: Normal range of motion. Neck supple.  Cardiovascular: Normal rate, regular rhythm and normal heart sounds.   Pulmonary/Chest: Effort normal and breath sounds normal.  Nursing note and vitals reviewed.   Urgent Care Course     Procedures (including critical care time)  Labs Review Labs Reviewed - No data to display  Imaging Review No results found.   Visual Acuity Review  Right Eye Distance:   Left Eye Distance:   Bilateral Distance:    Right Eye Near:   Left Eye Near:    Bilateral Near:         MDM   1. Pain, dental   2. Dental caries    Tylenol 975mg  po now Toradol 60mg  IM Amoxicillin 875mg  one po bid x 10 days #20      Deatra CanterOxford, Sally-Anne Wamble J, FNP 04/04/17 1542

## 2017-04-04 NOTE — ED Triage Notes (Signed)
Pain for a week, bottom, left tooth is hurting

## 2017-09-16 ENCOUNTER — Emergency Department (HOSPITAL_COMMUNITY)
Admission: EM | Admit: 2017-09-16 | Discharge: 2017-09-16 | Disposition: A | Discharge status: Home / Self Care | Payer: Self-pay | Attending: Emergency Medicine | Admitting: Emergency Medicine

## 2017-09-16 ENCOUNTER — Other Ambulatory Visit: Payer: Self-pay

## 2017-09-16 ENCOUNTER — Encounter (HOSPITAL_COMMUNITY): Payer: Self-pay | Admitting: Emergency Medicine

## 2017-09-16 DIAGNOSIS — Z202 Contact with and (suspected) exposure to infections with a predominantly sexual mode of transmission: Secondary | ICD-10-CM | POA: Insufficient documentation

## 2017-09-16 DIAGNOSIS — Z79899 Other long term (current) drug therapy: Secondary | ICD-10-CM | POA: Insufficient documentation

## 2017-09-16 DIAGNOSIS — F17210 Nicotine dependence, cigarettes, uncomplicated: Secondary | ICD-10-CM | POA: Insufficient documentation

## 2017-09-16 HISTORY — DX: Other pneumothorax: J93.83

## 2017-09-16 MED ORDER — AZITHROMYCIN 250 MG PO TABS
1000.0000 mg | ORAL_TABLET | Freq: Once | ORAL | Status: AC
  Administered 2017-09-16: 1000 mg via ORAL
  Filled 2017-09-16: qty 4

## 2017-09-16 MED ORDER — CEFTRIAXONE SODIUM 250 MG IJ SOLR
250.0000 mg | Freq: Once | INTRAMUSCULAR | Status: AC
  Administered 2017-09-16: 250 mg via INTRAMUSCULAR
  Filled 2017-09-16: qty 250

## 2017-09-16 MED ORDER — STERILE WATER FOR INJECTION IJ SOLN
INTRAMUSCULAR | Status: AC
Start: 2017-09-16 — End: 2017-09-16
  Administered 2017-09-16: 2.1 mL
  Filled 2017-09-16: qty 10

## 2017-09-16 NOTE — ED Notes (Signed)
ED Provider at bedside. 

## 2017-09-16 NOTE — ED Triage Notes (Signed)
Pt here for STD check, pt states he was notified by a unprotected sex partner they tested positive for gonorrhea. Pt denies s/s

## 2017-09-16 NOTE — Discharge Instructions (Signed)
Please read and follow all provided instructions.  Today you were cultured for a sexually transmitted disease like chlamydia or gonorrhea. You have elected to be treated at this time as a precaution. Results of your gonorrhea and chlamydia tests are pending and you will be notified if they are positive. Please refrain from sexual activity for 48 hours  It is very important to practice safe sex and use condoms when sexually active. If the test is positive, please refrain from sexual activity for 10 days for the medicine to take effect and notify your sexual partners for the last 6 months as they, too, may want to be tested. The website https://garcia.net/http://www.dontspreadit.com/ can be used to send anonymous text messages or emails to alert sexual contacts.  Because you have had unprotected sex, you may want to consider getting tested for HIV (and Syphilis) as well. If you did not elect to be tested for HIV or Syphilis (blood tests) in the department today, you can follow up at the Health Department for further testing for this. Remember that latex condoms or abstinence are the only way to prevent against STDs or HIV.  If you should develop severe or worsening pain in your abdomen or the pelvis or develop severe fevers, nausea or vomiting that prevent you from taking your medications, return to the emergency department immediately. Otherwise contact your local physician or county health department for a follow up appointment to complete STD testing including HIV and syphilis. I have attached the information for the health department.   Gonorrhea and Chlamydia SYMPTOMS  In males, symptoms include:  -Burning with urination.  -Pain in the testicles.  -Watery mucous-like discharge from the penis.  It can cause longstanding (chronic) pelvic pain after frequent infections.  TREATMENT  -You have elected to be treated at this time as a precaution. Return if you develop an itchy rash, swelling in your mouth or lips, or  difficulty breathing.  -This is a sexually transmitted infection. So you are also at risk for other sexually transmitted diseases, including HIV (AIDS), it is recommended that you get tested. HOME CARE INSTRUCTIONS  Warning: This infection is contagious. Do not have sex until treatment is completed. Follow up at your caregiver's office or the clinic to which you were referred. If your diagnosis (learning what is wrong) is confirmed by culture or some other method, your recent sexual contacts need treatment. Even if they are symptom free or have a negative culture or evaluation, they should be treated.  PREVENTION  Practice safe sex, use condoms, have only one sex partner and be sure your sex partner is not having sex with others.  Ask your caregiver to test you for chlamydia at your regular checkups or sooner if you are having symptoms.  Ask for further information if you are pregnant.  SEEK IMMEDIATE MEDICAL CARE IF:  Please return to the Emergency Department if you do not get better, if you get worse, or new symptoms OR You develop an oral temperature above 102 F (38.9 C), not controlled by medications or lasting more than 2 days.  You develop an increase in pain.  You develop any type of abnormal discharge.  You develop vaginal bleeding and it is not time for your period.  You develop painful intercourse.  Please return if you have any other emergent concerns Additional Information:  Your vital signs today were: BP 127/84 (BP Location: Right Arm)    Pulse 64    Temp 98.2 F (36.8 C) (Oral)  Resp 16    Ht 6\' 1"  (1.854 m)    Wt 90.7 kg (200 lb)    SpO2 99%    BMI 26.39 kg/m  If your blood pressure (BP) was elevated above 135/85 this visit, please have this repeated by your doctor within one month. ---------------

## 2017-09-16 NOTE — ED Provider Notes (Signed)
[facility] Provider Note   CSN: [id number] Arrival date & time: 09/16/17  2143     History   Chief Complaint Chief Complaint  Patient presents with  . Exposure to STD    HPI Kenneth Arellano is a 36 y.o. male who presents to the emergency department today for exposure to STD.  Patient states that he was notified a partner he had unprotected sex with tested positive for gonorrhea.  He denies any current symptoms including penile pain, penile discharge, testicular pain, testicular swelling, scrotal pain, ulcers/lesions/warts, pruritus, painful bowel movements or fever.  Patient is currently sexually active with 2 partners.  He has notified the other partner of his known exposure.  He has never had STDs in the past.  He denies any other symptoms at this time.  HPI  Past Medical History:  Diagnosis Date  . Spontaneous pneumothorax     There are no active problems to display for this patient.   Past Surgical History:  Procedure Laterality Date  . CHEST TUBE INSERTION         Home Medications    Prior to Admission medications   Medication Sig Start Date End Date Taking? Authorizing Provider  amoxicillin (AMOXIL) 875 MG tablet Take 1 tablet (875 mg total) by mouth 2 (two) times daily. 04/04/17   Deatra Canterxford, William J, FNP  cyclobenzaprine (FLEXERIL) 10 MG tablet Take 1 tablet (10 mg total) by mouth 3 (three) times daily as needed for muscle spasms. 01/13/17   Street, HanaleiMercedes, PA-C  ibuprofen (ADVIL,MOTRIN) 200 MG tablet Take 200 mg by mouth every 6 (six) hours as needed.    [provider]  ibuprofen (ADVIL,MOTRIN) 800 MG tablet Take 1 tablet (800 mg total) by mouth 3 (three) times daily. 04/04/17   Deatra Canterxford, William J, FNP  lidocaine (LIDODERM) 5 % Place 1 patch onto the skin daily. Remove & Discard patch within 12 hours or as directed by MD 01/21/16   Joy, Shawn C, PA-C  methocarbamol (ROBAXIN) 500 MG tablet Take 1 tablet (500 mg total)  by mouth 2 (two) times daily. 01/21/16   Joy, Shawn C, PA-C  naproxen (NAPROSYN) 500 MG tablet Take 1 tablet (500 mg total) by mouth 2 (two) times daily. 01/21/16   Joy, Shawn C, PA-C  naproxen (NAPROSYN) 500 MG tablet Take 1 tablet (500 mg total) by mouth 2 (two) times daily as needed for mild pain, moderate pain or headache (TAKE WITH MEALS.). 01/13/17   Street, Mercedes, PA-C  ondansetron (ZOFRAN) 4 MG tablet Take 1 tablet (4 mg total) by mouth every 6 (six) hours. 12/04/16   Roxy HorsemanBrowning, Robert, PA-C  oxyCODONE-acetaminophen (PERCOCET/ROXICET) 5-325 MG tablet Take 2 tablets by mouth every 6 (six) hours as needed for severe pain. 09/15/15   Corena HerterMumma, Shannon, MD  oxyCODONE-acetaminophen (PERCOCET/ROXICET) 5-325 MG tablet Take 1 tablet by mouth every 4 (four) hours as needed for severe pain. 01/21/16   Joy, Hillard DankerShawn C, PA-C    Family History Family History  Problem Relation Age of Onset  . Hypertension Other     Social History Social History   Tobacco Use  . Smoking status: Current Every Day Smoker    Packs/day: 0.50    Types: Cigarettes  . Smokeless tobacco: Never Used  Substance Use Topics  . Alcohol use: Yes    Comment: social  . Drug use: Yes    Types: Marijuana     Allergies   Patient has no known allergies.   Review of  Systems Review of Systems  Constitutional: Negative for chills and fever.  Gastrointestinal: Negative for abdominal pain, nausea and vomiting.       No painful bowel movements  Genitourinary: Negative for difficulty urinating, discharge, dysuria, flank pain, frequency, hematuria, penile pain, penile swelling, scrotal swelling, testicular pain and urgency.  Skin: Negative for rash.     Physical Exam Updated Vital Signs BP 127/84 (BP Location: Right Arm)   Pulse 64   Temp 98.2 F (36.8 C) (Oral)   Resp 16   Ht 6\' 1"  (1.854 m)   Wt 90.7 kg (200 lb)   SpO2 99%   BMI 26.39 kg/m   Physical Exam  Constitutional: He appears well-developed and well-nourished.   HENT:  Head: Normocephalic and atraumatic.  Right Ear: External ear normal.  Left Ear: External ear normal.  Eyes: Conjunctivae are normal. Right eye exhibits no discharge. Left eye exhibits no discharge. No scleral icterus.  Pulmonary/Chest: Effort normal. No respiratory distress.  Abdominal: Normal appearance and bowel sounds are normal. He exhibits no distension. There is no tenderness. There is no rigidity, no rebound, no guarding and no CVA tenderness.  Genitourinary: Testes normal and penis normal. Cremasteric reflex is present. Right testis shows no mass, no swelling and no tenderness. Right testis is descended. Left testis shows no mass, no swelling and no tenderness. Left testis is descended. Circumcised. No phimosis, penile erythema or penile tenderness. No discharge found.  Genitourinary Comments: No rash or lesions visualized.  Lymphadenopathy: No inguinal adenopathy noted on the right or left side.  Neurological: He is alert.  Skin: No pallor.  Psychiatric: He has a normal mood and affect.  Nursing note and vitals reviewed.    ED Treatments / Results  Labs (all labs ordered are listed, but only abnormal results are displayed) Labs Reviewed  RPR  HIV ANTIBODY (ROUTINE TESTING)  GC/CHLAMYDIA PROBE AMP ([place]) NOT AT [facility]    EKG  EKG Interpretation None       Radiology No results found.  Procedures Procedures (including critical care time)  Medications Ordered in ED Medications  azithromycin (ZITHROMAX) tablet 1,000 mg (not administered)  cefTRIAXone (ROCEPHIN) injection 250 mg (not administered)     Initial Impression / Assessment and Plan / ED Course  I have reviewed the triage vital signs and the nursing notes.  Pertinent labs & imaging results that were available during my care of the patient were reviewed by me and considered in my medical decision making (see chart for details).     Patient is afebrile without abdominal tenderness,  abdominal pain or painful bowel movements to indicate prostatitis.  No tenderness to palpation of the testes or epididymis to suggest orchitis or epididymitis.  STD cultures obtained including HIV, syphilis, gonorrhea and chlamydia. Patient to be discharged with instructions to follow up with PCP. Discussed importance of using protection when sexually active. Pt understands that they have GC/Chlamydia cultures pending and that they will need to inform all sexual partners if results return positive. Patient has been treated prophylactically with azithromycin and Rocephin. Patient to follow up with PCP/health department.   Final Clinical Impressions(s) / ED Diagnoses   Final diagnoses:  STD exposure    ED Discharge Orders    None       Princella PellegriniMaczis, Ruth Tully M, PA-C 09/16/17 2226    Lorre NickAllen, Anthony, MD 09/17/17 1655

## 2017-09-17 LAB — RPR: RPR: NONREACTIVE

## 2017-09-17 LAB — HIV ANTIBODY (ROUTINE TESTING W REFLEX): HIV SCREEN 4TH GENERATION: NONREACTIVE

## 2017-09-18 LAB — GC/CHLAMYDIA PROBE AMP (~~LOC~~) NOT AT ARMC
Chlamydia: NEGATIVE
NEISSERIA GONORRHEA: NEGATIVE

## 2017-10-09 ENCOUNTER — Other Ambulatory Visit: Payer: Self-pay

## 2017-10-09 ENCOUNTER — Emergency Department (HOSPITAL_COMMUNITY)
Admission: EM | Admit: 2017-10-09 | Discharge: 2017-10-09 | Disposition: A | Discharge status: Home / Self Care | Payer: Self-pay | Attending: Emergency Medicine | Admitting: Emergency Medicine

## 2017-10-09 ENCOUNTER — Encounter (HOSPITAL_COMMUNITY): Payer: Self-pay | Admitting: *Deleted

## 2017-10-09 DIAGNOSIS — Z79899 Other long term (current) drug therapy: Secondary | ICD-10-CM | POA: Insufficient documentation

## 2017-10-09 DIAGNOSIS — K0889 Other specified disorders of teeth and supporting structures: Secondary | ICD-10-CM | POA: Insufficient documentation

## 2017-10-09 DIAGNOSIS — F17210 Nicotine dependence, cigarettes, uncomplicated: Secondary | ICD-10-CM | POA: Insufficient documentation

## 2017-10-09 MED ORDER — IBUPROFEN 600 MG PO TABS: 600.0000 mg | ORAL_TABLET | Freq: Three times a day (TID) | ORAL | 0 refills | Status: DC | PRN

## 2017-10-09 MED ORDER — PENICILLIN V POTASSIUM 500 MG PO TABS: 500.0000 mg | ORAL_TABLET | Freq: Three times a day (TID) | ORAL | 0 refills | Status: AC

## 2017-10-09 MED ORDER — PENICILLIN V POTASSIUM 250 MG PO TABS
500.0000 mg | ORAL_TABLET | Freq: Once | ORAL | Status: AC
  Administered 2017-10-09: 500 mg via ORAL
  Filled 2017-10-09: qty 2

## 2017-10-09 MED ORDER — IBUPROFEN 400 MG PO TABS
600.0000 mg | ORAL_TABLET | Freq: Once | ORAL | Status: AC
  Administered 2017-10-09: 600 mg via ORAL
  Filled 2017-10-09: qty 1

## 2017-10-09 MED ORDER — OXYCODONE-ACETAMINOPHEN 5-325 MG PO TABS
1.0000 | ORAL_TABLET | Freq: Once | ORAL | Status: AC
  Administered 2017-10-09: 1 via ORAL
  Filled 2017-10-09: qty 1

## 2017-10-09 NOTE — ED Provider Notes (Signed)
[facility] Provider Note   CSN: [id number] Arrival date & time: 10/09/17  0134     History   Chief Complaint Chief Complaint  Patient presents with  . Dental Pain    HPI Kenneth Arellano is a 36 y.o. male.  HPI 36 year old male presents to the emergency department with left lower dental pain over the past 2 days.  No difficulty breathing or swallowing.  Pain is mild in severity.  No other complaints at this time.   Past Medical History:  Diagnosis Date  . Spontaneous pneumothorax     There are no active problems to display for this patient.   Past Surgical History:  Procedure Laterality Date  . CHEST TUBE INSERTION         Home Medications    Prior to Admission medications   Medication Sig Start Date End Date Taking? Authorizing Provider  amoxicillin (AMOXIL) 875 MG tablet Take 1 tablet (875 mg total) by mouth 2 (two) times daily. 04/04/17   Deatra Canterxford, William J, FNP  cyclobenzaprine (FLEXERIL) 10 MG tablet Take 1 tablet (10 mg total) by mouth 3 (three) times daily as needed for muscle spasms. 01/13/17   Street, Red OakMercedes, PA-C  ibuprofen (ADVIL,MOTRIN) 600 MG tablet Take 1 tablet (600 mg total) by mouth every 8 (eight) hours as needed. 10/09/17   Azalia Bilisampos, [place] Kretz, MD  lidocaine (LIDODERM) 5 % Place 1 patch onto the skin daily. Remove & Discard patch within 12 hours or as directed by MD 01/21/16   Joy, Shawn C, PA-C  methocarbamol (ROBAXIN) 500 MG tablet Take 1 tablet (500 mg total) by mouth 2 (two) times daily. 01/21/16   Joy, Shawn C, PA-C  naproxen (NAPROSYN) 500 MG tablet Take 1 tablet (500 mg total) by mouth 2 (two) times daily. 01/21/16   Joy, Shawn C, PA-C  naproxen (NAPROSYN) 500 MG tablet Take 1 tablet (500 mg total) by mouth 2 (two) times daily as needed for mild pain, moderate pain or headache (TAKE WITH MEALS.). 01/13/17   Street, Mercedes, PA-C  ondansetron (ZOFRAN) 4 MG tablet Take 1 tablet (4 mg total) by mouth every 6 (six)  hours. 12/04/16   Roxy HorsemanBrowning, Robert, PA-C  oxyCODONE-acetaminophen (PERCOCET/ROXICET) 5-325 MG tablet Take 2 tablets by mouth every 6 (six) hours as needed for severe pain. 09/15/15   Corena HerterMumma, Shannon, MD  oxyCODONE-acetaminophen (PERCOCET/ROXICET) 5-325 MG tablet Take 1 tablet by mouth every 4 (four) hours as needed for severe pain. 01/21/16   Joy, Shawn C, PA-C  penicillin v potassium (VEETID) 500 MG tablet Take 1 tablet (500 mg total) by mouth 3 (three) times daily for 7 days. 10/09/17 10/16/17  Azalia Bilisampos, Yedidya Duddy, MD    Family History Family History  Problem Relation Age of Onset  . Hypertension Other     Social History Social History   Tobacco Use  . Smoking status: Current Every Day Smoker    Packs/day: 0.50    Types: Cigarettes  . Smokeless tobacco: Never Used  Substance Use Topics  . Alcohol use: Yes    Comment: social  . Drug use: Yes    Types: Marijuana     Allergies   Patient has no known allergies.   Review of Systems Review of Systems  All other systems reviewed and are negative.    Physical Exam Updated Vital Signs BP (!) 148/90   Pulse 78   Temp 99 F (37.2 C)   Resp 16   Ht 6\' 1"  (1.854 m)   Wt  90.7 kg (200 lb)   SpO2 97%   BMI 26.39 kg/m   Physical Exam  Constitutional: He is oriented to person, place, and time. He appears well-developed and well-nourished.  HENT:  Head: Normocephalic.  Left lower dental tenderness of third molar.  No gingival swelling or fluctuance.  Tolerating secretions.  Oral airway patent.  Eyes: EOM are normal.  Neck: Normal range of motion.  Pulmonary/Chest: Effort normal.  Abdominal: He exhibits no distension.  Musculoskeletal: Normal range of motion.  Neurological: He is alert and oriented to person, place, and time.  Psychiatric: He has a normal mood and affect.  Nursing note and vitals reviewed.    ED Treatments / Results  Labs (all labs ordered are listed, but only abnormal results are displayed) Labs Reviewed -  No data to display  EKG  EKG Interpretation None       Radiology No results found.  Procedures Procedures (including critical care time)  Medications Ordered in ED Medications  ibuprofen (ADVIL,MOTRIN) tablet 600 mg (600 mg Oral Given 10/09/17 0559)  oxyCODONE-acetaminophen (PERCOCET/ROXICET) 5-325 MG per tablet 1 tablet (1 tablet Oral Given 10/09/17 0559)  penicillin v potassium (VEETID) tablet 500 mg (500 mg Oral Given 10/09/17 0559)     Initial Impression / Assessment and Plan / ED Course  I have reviewed the triage vital signs and the nursing notes.  Pertinent labs & imaging results that were available during my care of the patient were reviewed by me and considered in my medical decision making (see chart for details).     Dental Pain. Home with antibiotics and pain medicine. Recommend dental follow up. No signs of gingival abscess. Tolerating secretions. Airway patent. No sub lingular swelling  Final Clinical Impressions(s) / ED Diagnoses   Final diagnoses:  Pain, dental    ED Discharge Orders        Ordered    ibuprofen (ADVIL,MOTRIN) 600 MG tablet  Every 8 hours PRN     10/09/17 0614    penicillin v potassium (VEETID) 500 MG tablet  3 times daily     10/09/17 [id number]       Azalia Bilisampos, Tesha Archambeau, MD 10/09/17 [phone]

## 2017-10-09 NOTE — ED Triage Notes (Signed)
Pt c/o a toothache for 2-3 days    Temp also

## 2017-10-09 NOTE — Discharge Instructions (Signed)
Please call a dentist for follow up °

## 2017-11-20 ENCOUNTER — Emergency Department (HOSPITAL_COMMUNITY): Payer: Self-pay

## 2017-11-20 ENCOUNTER — Encounter (HOSPITAL_COMMUNITY): Payer: Self-pay | Admitting: Emergency Medicine

## 2017-11-20 ENCOUNTER — Other Ambulatory Visit: Payer: Self-pay

## 2017-11-20 DIAGNOSIS — Z79899 Other long term (current) drug therapy: Secondary | ICD-10-CM | POA: Insufficient documentation

## 2017-11-20 DIAGNOSIS — R7989 Other specified abnormal findings of blood chemistry: Secondary | ICD-10-CM | POA: Insufficient documentation

## 2017-11-20 DIAGNOSIS — F17210 Nicotine dependence, cigarettes, uncomplicated: Secondary | ICD-10-CM | POA: Insufficient documentation

## 2017-11-20 DIAGNOSIS — R0789 Other chest pain: Secondary | ICD-10-CM | POA: Insufficient documentation

## 2017-11-20 LAB — CBC
HEMATOCRIT: 45.8 % (ref 39.0–52.0)
Hemoglobin: 15.7 g/dL (ref 13.0–17.0)
MCH: 31.6 pg (ref 26.0–34.0)
MCHC: 34.3 g/dL (ref 30.0–36.0)
MCV: 92.2 fL (ref 78.0–100.0)
Platelets: 261 10*3/uL (ref 150–400)
RBC: 4.97 MIL/uL (ref 4.22–5.81)
RDW: 13.9 % (ref 11.5–15.5)
WBC: 7.4 10*3/uL (ref 4.0–10.5)

## 2017-11-20 LAB — I-STAT TROPONIN, ED: Troponin i, poc: 0 ng/mL (ref 0.00–0.08)

## 2017-11-20 NOTE — ED Triage Notes (Signed)
Pt c/o non radiating chest pain x 4 days and shortness of breath starting today. Hx spontaneous pneumo. Respirations e/u.

## 2017-11-21 ENCOUNTER — Emergency Department (HOSPITAL_COMMUNITY)
Admission: EM | Admit: 2017-11-21 | Discharge: 2017-11-21 | Disposition: A | Discharge status: Home / Self Care | Payer: Self-pay | Attending: Emergency Medicine | Admitting: Emergency Medicine

## 2017-11-21 DIAGNOSIS — R7989 Other specified abnormal findings of blood chemistry: Secondary | ICD-10-CM

## 2017-11-21 DIAGNOSIS — R0789 Other chest pain: Secondary | ICD-10-CM

## 2017-11-21 LAB — BASIC METABOLIC PANEL
Anion gap: 11 (ref 5–15)
BUN: 9 mg/dL (ref 6–20)
CHLORIDE: 106 mmol/L (ref 101–111)
CO2: 21 mmol/L — ABNORMAL LOW (ref 22–32)
CREATININE: 1.52 mg/dL — AB (ref 0.61–1.24)
Calcium: 9.1 mg/dL (ref 8.9–10.3)
GFR calc Af Amer: >60 mL/min (ref >60)
GFR calc non Af Amer: 57 mL/min — ABNORMAL LOW (ref >60)
GLUCOSE: 119 mg/dL — AB (ref 65–99)
Potassium: 3.9 mmol/L (ref 3.5–5.1)
SODIUM: 138 mmol/L (ref 135–145)

## 2017-11-21 NOTE — ED Provider Notes (Signed)
[facility] EMERGENCY DEPARTMENT Provider Note   CSN: [id number] Arrival date & time: 11/20/17  2314     History   Chief Complaint Chief Complaint  Patient presents with  . Chest Pain  . Shortness of Breath    HPI Kenneth Arellano is a 37 y.o. male with history of spontaneous pneumothorax presents today for evaluation of acute onset, constant chest pressure for 2 days.  He states that the pain is anterior and does not radiate.  Pain worsens laying back and with certain position and is relieved somewhat sitting up.  He also endorses shortness of breath that occurs laying flat on his back as well.  He denies any recent illnesses, but states he has had a nonproductive cough for the past 8-9 months.  No fevers, diaphoresis, or syncope.  He endorses some nausea but no vomiting.  Denies abdominal pain, urinary symptoms, or diarrhea or constipation.  He is a current smoker, occasional alcohol use, and drinks multiple caffeinated sodas daily.  Denies recreational drug use.  Denies leg swelling or palpitations.  Has not tried anything for his symptoms.  Denies any trauma.  No recent travel or surgeries, no hemoptysis, not on testosterone replacement therapy, no prior history of DVT or PE.  States this feels different from the time that he had a pneumothorax.  The history is provided by the patient.    Past Medical History:  Diagnosis Date  . Spontaneous pneumothorax     There are no active problems to display for this patient.   Past Surgical History:  Procedure Laterality Date  . CHEST TUBE INSERTION         Home Medications    Prior to Admission medications   Medication Sig Start Date End Date Taking? Authorizing Provider  amoxicillin (AMOXIL) 875 MG tablet Take 1 tablet (875 mg total) by mouth 2 (two) times daily. 04/04/17   Deatra Canter, FNP  cyclobenzaprine (FLEXERIL) 10 MG tablet Take 1 tablet (10 mg total) by mouth 3 (three) times daily as needed for  muscle spasms. 01/13/17   Street, [place], PA-C  ibuprofen (ADVIL,MOTRIN) 600 MG tablet Take 1 tablet (600 mg total) by mouth every 8 (eight) hours as needed. 10/09/17   Azalia Bilis, MD  lidocaine (LIDODERM) 5 % Place 1 patch onto the skin daily. Remove & Discard patch within 12 hours or as directed by MD 01/21/16   Joy, Shawn C, PA-C  methocarbamol (ROBAXIN) 500 MG tablet Take 1 tablet (500 mg total) by mouth 2 (two) times daily. 01/21/16   Joy, Shawn C, PA-C  naproxen (NAPROSYN) 500 MG tablet Take 1 tablet (500 mg total) by mouth 2 (two) times daily. 01/21/16   Joy, Shawn C, PA-C  naproxen (NAPROSYN) 500 MG tablet Take 1 tablet (500 mg total) by mouth 2 (two) times daily as needed for mild pain, moderate pain or headache (TAKE WITH MEALS.). 01/13/17   Street, Mercedes, PA-C  ondansetron (ZOFRAN) 4 MG tablet Take 1 tablet (4 mg total) by mouth every 6 (six) hours. 12/04/16   Roxy Horseman, PA-C  oxyCODONE-acetaminophen (PERCOCET/ROXICET) 5-325 MG tablet Take 2 tablets by mouth every 6 (six) hours as needed for severe pain. 09/15/15   Corena Herter, MD  oxyCODONE-acetaminophen (PERCOCET/ROXICET) 5-325 MG tablet Take 1 tablet by mouth every 4 (four) hours as needed for severe pain. 01/21/16   Joy, Hillard Danker, PA-C    Family History Family History  Problem Relation Age of Onset  . Hypertension Other  Social History Social History   Tobacco Use  . Smoking status: Current Every Day Smoker    Packs/day: 0.50    Types: Cigarettes  . Smokeless tobacco: Never Used  Substance Use Topics  . Alcohol use: Yes    Comment: social  . Drug use: Yes    Types: Marijuana     Allergies   Patient has no known allergies.   Review of Systems Review of Systems  Constitutional: Negative for chills and fever.  Respiratory: Positive for cough (chronic, unchanged) and shortness of breath.   Cardiovascular: Positive for chest pain. Negative for leg swelling.  Gastrointestinal: Positive for nausea.  Negative for abdominal pain, blood in stool, constipation, diarrhea and vomiting.  Genitourinary: Negative for decreased urine volume.  Neurological: Negative for syncope and light-headedness.  All other systems reviewed and are negative.    Physical Exam Updated Vital Signs BP 129/73 (BP Location: Right Arm)   Pulse 62   Temp 98.8 F (37.1 C) (Oral)   Resp 18   Ht 6\' 1"  (1.854 m)   Wt 90.7 kg (200 lb)   SpO2 99%   BMI 26.39 kg/m   Physical Exam  Constitutional: He appears well-developed and well-nourished. No distress.  HENT:  Head: Normocephalic and atraumatic.  Eyes: Conjunctivae are normal. Right eye exhibits no discharge. Left eye exhibits no discharge.  Neck: Normal range of motion. Neck supple. No JVD present. No tracheal deviation present.  Cardiovascular: Normal rate and regular rhythm.  Pulses:      Carotid pulses are 2+ on the right side, and 2+ on the left side.      Radial pulses are 2+ on the right side, and 2+ on the left side.       Dorsalis pedis pulses are 2+ on the right side, and 2+ on the left side.       Posterior tibial pulses are 2+ on the right side, and 2+ on the left side.   negative Homan's bl  Pulmonary/Chest: Effort normal and breath sounds normal. No accessory muscle usage or stridor. No tachypnea. No respiratory distress.  Chest wall focally tender to palpation anteriorly parasternally on the right side.  Equal rise and fall of chest, no increased work of breathing, speaking in full sentences without difficulty    Abdominal: Soft. Bowel sounds are normal. He exhibits no distension. There is no tenderness.  Musculoskeletal: He exhibits no edema.       Right lower leg: Normal. He exhibits no tenderness and no edema.       Left lower leg: Normal. He exhibits no tenderness and no edema.  Neurological: He is alert.  Skin: Skin is warm and dry. No erythema.  Psychiatric: He has a normal mood and affect. His behavior is normal.  Nursing note and  vitals reviewed.    ED Treatments / Results  Labs (all labs ordered are listed, but only abnormal results are displayed) Labs Reviewed  BASIC METABOLIC PANEL - Abnormal; Notable for the following components:      Result Value   CO2 21 (*)    Glucose, Bld 119 (*)    Creatinine, Ser 1.52 (*)    GFR calc non Af Amer 57 (*)    All other components within normal limits  CBC  I-STAT TROPONIN, ED    EKG  EKG Interpretation  Date/Time:  Monday November 20 2017 23:17:44 EST Ventricular Rate:  73 PR Interval:  134 QRS Duration: 82 QT Interval:  374 QTC Calculation: 412  R Axis:   58 Text Interpretation:  Normal sinus rhythm with sinus arrhythmia Normal ECG Confirmed by Gilda Crease [phone]) on 11/21/2017 5:31:10 AM Also confirmed by Gilda Crease [phone]), editor Elita Quick [phone])  on 11/21/2017 7:04:23 AM       Radiology Dg Chest 2 View  Result Date: 11/20/2017 CLINICAL DATA:  Right lung collapse 1-1/2 years ago. Now chest pain with cough for 2 days. EXAM: CHEST  2 VIEW COMPARISON:  11/17/2016 FINDINGS: The heart size and mediastinal contours are within normal limits. Both lungs are clear. The visualized skeletal structures are unremarkable. IMPRESSION: No active cardiopulmonary disease. Electronically Signed   By: Burman Nieves M.D.   On: 11/20/2017 23:45    Procedures Procedures (including critical care time)  Medications Ordered in ED Medications - No data to display   Initial Impression / Assessment and Plan / ED Course  I have reviewed the triage vital signs and the nursing notes.  Pertinent labs & imaging results that were available during my care of the patient were reviewed by me and considered in my medical decision making (see chart for details).     Patient presenting with constant chest wall pain reproducible on palpation for 2 days.  Afebrile, vital signs are stable.  He is nontoxic in appearance.  He also has mild shortness of  breath laying flat and pain worsens laying flat.  Initial troponin is negative, and I do not see a need to repeat given his symptoms have been constant for 2 days.  EKG shows no significant ST segment abnormality or arrhythmia.  Chest x-ray shows no acute cardiopulmonary abnormalities with no evidence of pneumothorax, and breath sounds are equal and clear bilaterally.  He exhibits no tracheal deviation or respiratory distress.  Lab work is reassuring, however he does have a slight elevation in his creatinine.  Chart review shows that this has been fluctuating over the past several years and is intermittently mildly elevated.  SPECT this is likely due to dehydration.  Advised the patient to follow-up with a primary care physician or return to the ED in 1 week for repeat creatinine.  He is PERC negative and I doubt PE.  I doubt ACS/MI in this patient with HEART score 2.  Doubt AAA.  Pain is reproducible on palpation, suggesting musculoskeletal etiology however I have also considered GERD and pericarditis although he has not had any recent illness and no EKG changes today.  No evidence of myocarditis or myositis.  Advised patient to begin taking anti-inflammatories and Tylenol for pain, and he will try a trial of reflux medicine.  He will follow-up with primary care physician for reevaluation of his symptoms and repeat creatinine.  Discussed indications for return to the ED.  Patient and patient's girlfriend verbalized understanding of and agreement with plan and patient stable for discharge home at this time.  Final Clinical Impressions(s) / ED Diagnoses   Final diagnoses:  Atypical chest pain  Chest wall pain  Elevated serum creatinine    ED Discharge Orders    None       Jeanie Sewer, PA-C 11/21/17 0720    Gilda Crease, MD 11/21/17 [phone]

## 2017-11-21 NOTE — Discharge Instructions (Signed)
Alternate 600 mg of ibuprofen and [phone] mg of Tylenol every 3 hours as needed for pain. Do not exceed 4000 mg of Tylenol daily. Start taking tums or prilosec over the counter for possible acid reflux.  Avoid foods that are very acidic in nature, as well as alcohol.  Follow-up with [place] [facility] wellness for reevaluation of your symptoms and also recheck of your kidney function.  You may also return here in 1 week for this lab test.  Drink plenty of fluids and get plenty of rest in the meantime.  Return to the emergency department sooner if any concerning signs or symptoms develop such as worsening chest pain or shortness of breath, fevers, or coughing up blood.

## 2018-05-17 ENCOUNTER — Encounter (HOSPITAL_COMMUNITY): Payer: Self-pay

## 2018-05-17 ENCOUNTER — Emergency Department (HOSPITAL_COMMUNITY)
Admission: EM | Admit: 2018-05-17 | Discharge: 2018-05-18 | Disposition: A | Discharge status: Home / Self Care | Payer: Self-pay | Attending: Emergency Medicine | Admitting: Emergency Medicine

## 2018-05-17 DIAGNOSIS — S51811A Laceration without foreign body of right forearm, initial encounter: Secondary | ICD-10-CM | POA: Insufficient documentation

## 2018-05-17 DIAGNOSIS — F17210 Nicotine dependence, cigarettes, uncomplicated: Secondary | ICD-10-CM | POA: Insufficient documentation

## 2018-05-17 DIAGNOSIS — Y929 Unspecified place or not applicable: Secondary | ICD-10-CM | POA: Insufficient documentation

## 2018-05-17 DIAGNOSIS — Y998 Other external cause status: Secondary | ICD-10-CM | POA: Insufficient documentation

## 2018-05-17 DIAGNOSIS — Z23 Encounter for immunization: Secondary | ICD-10-CM | POA: Insufficient documentation

## 2018-05-17 DIAGNOSIS — W25XXXA Contact with sharp glass, initial encounter: Secondary | ICD-10-CM | POA: Insufficient documentation

## 2018-05-17 DIAGNOSIS — Y939 Activity, unspecified: Secondary | ICD-10-CM | POA: Insufficient documentation

## 2018-05-17 DIAGNOSIS — Z79899 Other long term (current) drug therapy: Secondary | ICD-10-CM | POA: Insufficient documentation

## 2018-05-17 NOTE — ED Triage Notes (Signed)
Pt states that he was cut with a piece of glass two days ago on the R arm, large laceration appx 4-5 in, last tetanus unknown. Bleeding controlled, pt has small laceration to both hands that are scabbed over

## 2018-05-18 MED ORDER — TETANUS-DIPHTH-ACELL PERTUSSIS 5-2.5-18.5 LF-MCG/0.5 IM SUSP
0.5000 mL | Freq: Once | INTRAMUSCULAR | Status: AC
  Administered 2018-05-18: 0.5 mL via INTRAMUSCULAR
  Filled 2018-05-18: qty 0.5

## 2018-05-18 MED ORDER — BACITRACIN ZINC 500 UNIT/GM EX OINT
1.0000 "application " | TOPICAL_OINTMENT | Freq: Two times a day (BID) | CUTANEOUS | Status: DC
  Administered 2018-05-18: 1 via TOPICAL
  Filled 2018-05-18: qty 1.8

## 2018-05-18 NOTE — ED Provider Notes (Signed)
[facility] Provider Note   CSN: [id number] Arrival date & time: 05/17/18  2248     History   Chief Complaint Chief Complaint  Patient presents with  . Laceration    HPI Kenneth Arellano is a 37 y.o. male.  Patient presents to the emergency department with chief complaint of laceration.  He states that he cut his forearm on some glass 2 days ago.  He states that he came today to be evaluated.  His last tetanus shot is unknown.  He is not bleeding.  He has not taken anything for his symptoms.  He has bandage the wound with gauze.  The history is provided by the patient. No language interpreter was used.    Past Medical History:  Diagnosis Date  . Spontaneous pneumothorax     There are no active problems to display for this patient.   Past Surgical History:  Procedure Laterality Date  . CHEST TUBE INSERTION          Home Medications    Prior to Admission medications   Medication Sig Start Date End Date Taking? Authorizing Provider  amoxicillin (AMOXIL) 875 MG tablet Take 1 tablet (875 mg total) by mouth 2 (two) times daily. 04/04/17   Deatra Canterxford, William J, FNP  cyclobenzaprine (FLEXERIL) 10 MG tablet Take 1 tablet (10 mg total) by mouth 3 (three) times daily as needed for muscle spasms. 01/13/17   Street, [place], PA-C  ibuprofen (ADVIL,MOTRIN) 600 MG tablet Take 1 tablet (600 mg total) by mouth every 8 (eight) hours as needed. 10/09/17   Azalia Bilisampos, Kevin, MD  lidocaine (LIDODERM) 5 % Place 1 patch onto the skin daily. Remove & Discard patch within 12 hours or as directed by MD 01/21/16   Joy, Shawn C, PA-C  methocarbamol (ROBAXIN) 500 MG tablet Take 1 tablet (500 mg total) by mouth 2 (two) times daily. 01/21/16   Joy, Shawn C, PA-C  naproxen (NAPROSYN) 500 MG tablet Take 1 tablet (500 mg total) by mouth 2 (two) times daily. 01/21/16   Joy, Shawn C, PA-C  naproxen (NAPROSYN) 500 MG tablet Take 1 tablet (500 mg total) by mouth 2 (two) times  daily as needed for mild pain, moderate pain or headache (TAKE WITH MEALS.). 01/13/17   Street, Mercedes, PA-C  ondansetron (ZOFRAN) 4 MG tablet Take 1 tablet (4 mg total) by mouth every 6 (six) hours. 12/04/16   Roxy HorsemanBrowning, Creighton Longley, PA-C  oxyCODONE-acetaminophen (PERCOCET/ROXICET) 5-325 MG tablet Take 2 tablets by mouth every 6 (six) hours as needed for severe pain. 09/15/15   Corena HerterMumma, Shannon, MD  oxyCODONE-acetaminophen (PERCOCET/ROXICET) 5-325 MG tablet Take 1 tablet by mouth every 4 (four) hours as needed for severe pain. 01/21/16   Joy, Hillard DankerShawn C, PA-C    Family History Family History  Problem Relation Age of Onset  . Hypertension Other     Social History Social History   Tobacco Use  . Smoking status: Current Every Day Smoker    Packs/day: 0.50    Types: Cigarettes  . Smokeless tobacco: Never Used  Substance Use Topics  . Alcohol use: Yes    Comment: social  . Drug use: Yes    Types: Marijuana     Allergies   Patient has no known allergies.   Review of Systems Review of Systems  All other systems reviewed and are negative.    Physical Exam Updated Vital Signs BP 114/67 (BP Location: Left Arm)   Pulse (!) 50   Temp 97.9 F (  36.6 C) (Oral)   Resp 18   Ht 6' (1.829 m)   Wt 95.3 kg (210 lb)   SpO2 97%   BMI 28.48 kg/m   Physical Exam  Constitutional: He is oriented to person, place, and time. He appears well-developed and well-nourished.  HENT:  Head: Normocephalic and atraumatic.  Eyes: Conjunctivae and EOM are normal.  Neck: Normal range of motion.  Cardiovascular: Normal rate.  Pulmonary/Chest: Effort normal.  Abdominal: He exhibits no distension.  Musculoskeletal: Normal range of motion.  Range of motion and strength of right hand and wrist and fingers are 5/5  Neurological: He is alert and oriented to person, place, and time.  Skin: Skin is dry.  Two 6 cm very shallow lacerations to the right forearm, no deep structure involvement, no foreign body    Psychiatric: He has a normal mood and affect. His behavior is normal. Judgment and thought content normal.  Nursing note and vitals reviewed.    ED Treatments / Results  Labs (all labs ordered are listed, but only abnormal results are displayed) Labs Reviewed - No data to display  EKG None  Radiology No results found.  Procedures Procedures (including critical care time)  Medications Ordered in ED Medications  Tdap (BOOSTRIX) injection 0.5 mL (has no administration in time range)  bacitracin ointment 1 application (has no administration in time range)     Initial Impression / Assessment and Plan / ED Course  I have reviewed the triage vital signs and the nursing notes.  Pertinent labs & imaging results that were available during my care of the patient were reviewed by me and considered in my medical decision making (see chart for details).     Patient with 2 lacerations on his forearm.  Lacerations are greater than 6 hours old (2 days), will not repair with suture.  Will clean and dress wounds.  Will update tetanus shot.  Recommend PCP follow-up.  Final Clinical Impressions(s) / ED Diagnoses   Final diagnoses:  Laceration of right forearm, initial encounter    ED Discharge Orders    None       Roxy Horseman, PA-C 05/18/18 0257    Dione Booze, MD 05/18/18 [phone]

## 2018-11-16 ENCOUNTER — Ambulatory Visit (HOSPITAL_COMMUNITY)
Admission: EM | Admit: 2018-11-16 | Discharge: 2018-11-16 | Disposition: A | Discharge status: Home / Self Care | Payer: Self-pay | Attending: Family Medicine | Admitting: Family Medicine

## 2018-11-16 ENCOUNTER — Encounter (HOSPITAL_COMMUNITY): Payer: Self-pay | Admitting: Emergency Medicine

## 2018-11-16 DIAGNOSIS — F17210 Nicotine dependence, cigarettes, uncomplicated: Secondary | ICD-10-CM

## 2018-11-16 DIAGNOSIS — Z87448 Personal history of other diseases of urinary system: Secondary | ICD-10-CM | POA: Insufficient documentation

## 2018-11-16 DIAGNOSIS — R319 Hematuria, unspecified: Secondary | ICD-10-CM

## 2018-11-16 LAB — POCT URINALYSIS DIP (DEVICE)
Bilirubin Urine: NEGATIVE
GLUCOSE, UA: NEGATIVE mg/dL
HGB URINE DIPSTICK: NEGATIVE
Leukocytes, UA: NEGATIVE
Nitrite: NEGATIVE
PH: 6 (ref 5.0–8.0)
PROTEIN: NEGATIVE mg/dL
SPECIFIC GRAVITY, URINE: 1.025 (ref 1.005–1.030)
UROBILINOGEN UA: 0.2 mg/dL (ref 0.0–1.0)

## 2018-11-16 NOTE — ED Triage Notes (Signed)
Pt reports he noticed blood in his urine 2 days ago .  Denies: dysuria, polyuria   Sexually active in a monogamous relationship w/no condom use.   A&O X4... NAD.Marland Kitchen. ambulatory

## 2018-11-16 NOTE — Discharge Instructions (Addendum)
Drink plenty of water  Need follow up if this problem happens again  We did lab testing during this visit.  If there are any abnormal findings that require change in medicine or indicate a positive result, you will be notified.  If all of your tests are normal, you will not be called.   Make sure we have your correct phone number

## 2018-11-16 NOTE — ED Provider Notes (Signed)
[facility]    CSN: [id number] Arrival date & time: 11/16/18  1300     History   Chief Complaint Chief Complaint  Patient presents with  . Hematuria    HPI Kenneth Arellano is a 38 y.o. male.   HPI  Had one episode of hematuria 2 d ago No recurrence No penile discharge +new sexual contact without protection No symptoms now, frequency, discharge, rash Patient has no history of kidney stone or kidney infection.  He does not have any urinary frequency, dysuria, or penile discharge.  He states that he only noticed this 1 time.  He had not had any changes in his activity, heavy lifting, or dehydration.  No kidney problems in family. history of cigarette smoking.  Past Medical History:  Diagnosis Date  . Spontaneous pneumothorax     There are no active problems to display for this patient.   Past Surgical History:  Procedure Laterality Date  . CHEST TUBE INSERTION         Home Medications    Prior to Admission medications   Not on File    Family History Family History  Problem Relation Age of Onset  . Hypertension Other     Social History Social History   Tobacco Use  . Smoking status: Current Every Day Smoker    Packs/day: 0.50    Types: Cigarettes  . Smokeless tobacco: Never Used  Substance Use Topics  . Alcohol use: Yes    Comment: social  . Drug use: Yes    Types: Marijuana     Allergies   Patient has no known allergies.   Review of Systems Review of Systems  Constitutional: Negative for chills and fever.  HENT: Negative for ear pain and sore throat.   Eyes: Negative for pain and visual disturbance.  Respiratory: Negative for cough and shortness of breath.   Cardiovascular: Negative for chest pain and palpitations.  Gastrointestinal: Negative for abdominal pain and vomiting.  Genitourinary: Negative for dysuria and hematuria.  Musculoskeletal: Negative for arthralgias and back pain.  Skin: Negative for color change and rash.    Neurological: Negative for seizures and syncope.  All other systems reviewed and are negative.    Physical Exam Triage Vital Signs ED Triage Vitals  Enc Vitals Group     BP 11/16/18 1404 (!) 113/55     Pulse Rate 11/16/18 1404 (!) 57     Resp 11/16/18 1404 16     Temp 11/16/18 1404 98.2 F (36.8 C)     Temp Source 11/16/18 1404 Oral     SpO2 11/16/18 1404 100 %     Weight --      Height --      Head Circumference --      Peak Flow --      Pain Score 11/16/18 1402 0     Pain Loc --      Pain Edu? --      Excl. in GC? --    No data found.  Updated Vital Signs BP (!) 113/55 (BP Location: Right Arm)   Pulse (!) 57   Temp 98.2 F (36.8 C) (Oral)   Resp 16   SpO2 100%   Visual Acuity Right Eye Distance:   Left Eye Distance:   Bilateral Distance:    Right Eye Near:   Left Eye Near:    Bilateral Near:     Physical Exam Constitutional:      General: He is not in acute distress.  Appearance: He is well-developed.  HENT:     Head: Normocephalic and atraumatic.  Eyes:     Conjunctiva/sclera: Conjunctivae normal.     Pupils: Pupils are equal, round, and reactive to light.  Neck:     Musculoskeletal: Normal range of motion.  Cardiovascular:     Rate and Rhythm: Normal rate.  Pulmonary:     Effort: Pulmonary effort is normal. No respiratory distress.  Abdominal:     General: There is no distension.     Palpations: Abdomen is soft.     Comments: No abdominal tenderness.  No CVA tenderness.  Musculoskeletal: Normal range of motion.  Skin:    General: Skin is warm and dry.  Neurological:     Mental Status: He is alert.      UC Treatments / Results  Labs (all labs ordered are listed, but only abnormal results are displayed) Labs Reviewed  POCT URINALYSIS DIP (DEVICE) - Abnormal; Notable for the following components:      Result Value   Ketones, ur TRACE (*)    All other components within normal limits  URINE CULTURE  URINE CYTOLOGY ANCILLARY ONLY     EKG None  Radiology No results found.  Procedures Procedures (including critical care time)  Medications Ordered in UC Medications - No data to display  Initial Impression / Assessment and Plan / UC Course  I have reviewed the triage vital signs and the nursing notes.  Pertinent labs & imaging results that were available during my care of the patient were reviewed by me and considered in my medical decision making (see chart for details).    .  The patient had hematuria on occasion.  This can be from infection anywhere in the urinary tract or stone.  It can be from trauma.  Can be from dehydration and lifting.  It can be from a number of causes.  It happened 1 time.  It has not happened again.  There is no hematuria today.  There is little I can do in the office to evaluate his hematuria other than a urine culture.  He needs to follow-up with the PCP if he has a recurrence of this problem. Final Clinical Impressions(s) / UC Diagnoses   Final diagnoses:  History of hematuria     Discharge Instructions     Drink plenty of water  Need follow up if this problem happens again  We did lab testing during this visit.  If there are any abnormal findings that require change in medicine or indicate a positive result, you will be notified.  If all of your tests are normal, you will not be called.   Make sure we have your correct phone number    ED Prescriptions    None     Controlled Substance Prescriptions [place] Controlled Substance Registry consulted? Not Applicable   Eustace Moore, MD 11/16/18 2212

## 2018-11-18 LAB — URINE CULTURE: Culture: 20000 — AB

## 2018-11-19 LAB — URINE CYTOLOGY ANCILLARY ONLY
Chlamydia: NEGATIVE
NEISSERIA GONORRHEA: NEGATIVE
TRICH (WINDOWPATH): NEGATIVE

## 2018-12-27 ENCOUNTER — Emergency Department (HOSPITAL_COMMUNITY): Payer: Self-pay

## 2018-12-27 ENCOUNTER — Emergency Department (HOSPITAL_COMMUNITY)
Admission: EM | Admit: 2018-12-27 | Discharge: 2018-12-27 | Disposition: A | Discharge status: Home / Self Care | Payer: Self-pay | Attending: Emergency Medicine | Admitting: Emergency Medicine

## 2018-12-27 ENCOUNTER — Other Ambulatory Visit: Payer: Self-pay

## 2018-12-27 ENCOUNTER — Encounter (HOSPITAL_COMMUNITY): Payer: Self-pay | Admitting: Emergency Medicine

## 2018-12-27 DIAGNOSIS — F17210 Nicotine dependence, cigarettes, uncomplicated: Secondary | ICD-10-CM | POA: Insufficient documentation

## 2018-12-27 DIAGNOSIS — R05 Cough: Secondary | ICD-10-CM | POA: Insufficient documentation

## 2018-12-27 DIAGNOSIS — R053 Chronic cough: Secondary | ICD-10-CM

## 2018-12-27 MED ORDER — BENZONATATE 100 MG PO CAPS: 100.0000 mg | ORAL_CAPSULE | Freq: Three times a day (TID) | ORAL | 0 refills | Status: DC | PRN

## 2018-12-27 NOTE — Discharge Instructions (Signed)
Your chest x-ray did not show any evidence of collapsed lung, fluid around your lungs, pneumonia or other infection.  We have prescribed you Tessalon to use for cough management as needed.  You may also benefit from the use of Zyrtec or Claritin daily.  Discontinue smoking as this may be contributing to your ongoing symptoms.  Follow-up with a primary care doctor.

## 2018-12-27 NOTE — ED Triage Notes (Signed)
Patient c/o intermittent dry cough x1 year worsening recently.

## 2018-12-27 NOTE — ED Provider Notes (Signed)
[facility]-EMERGENCY DEPT Provider Note   CSN: [id number] Arrival date & time: 12/27/18  2151    History   Chief Complaint Chief Complaint  Patient presents with  . Cough    HPI Kenneth Arellano is a 38 y.o. male.     38 year old male with a history of spontaneous pneumothorax presents to the emergency department for evaluation of a cough.  Cough has been dry, nonproductive.  It has been sporadic and present x1 year.  The patient feels that it has subjectively worsened recently.  He denies taking any medications for his symptoms.  No associated sore throat, postnasal drip, congestion, hemoptysis, chest pain.  He does report sporadic posttussive emesis on occasion.  He is a smoker.  Is not on any daily medications, specifically no use of an ACE inhibitor.  No history of asthma or reflux.  The history is provided by the patient. No language interpreter was used.  Cough    Past Medical History:  Diagnosis Date  . Spontaneous pneumothorax     There are no active problems to display for this patient.   Past Surgical History:  Procedure Laterality Date  . CHEST TUBE INSERTION          Home Medications    Prior to Admission medications   Medication Sig Start Date End Date Taking? Authorizing Provider  benzonatate (TESSALON) 100 MG capsule Take 1 capsule (100 mg total) by mouth 3 (three) times daily as needed for cough. 12/27/18   Antony Madura, PA-C    Family History Family History  Problem Relation Age of Onset  . Hypertension Other     Social History Social History   Tobacco Use  . Smoking status: Current Every Day Smoker    Packs/day: 0.50    Types: Cigarettes  . Smokeless tobacco: Never Used  Substance Use Topics  . Alcohol use: Yes    Comment: social  . Drug use: Yes    Types: Marijuana     Allergies   Patient has no known allergies.   Review of Systems Review of Systems  Respiratory: Positive for cough.   Ten systems reviewed  and are negative for acute change, except as noted in the HPI.    Physical Exam Updated Vital Signs BP (!) 152/93   Pulse 90   Temp 98.5 F (36.9 C) (Oral)   Resp 20   SpO2 99%   Physical Exam Vitals signs and nursing note reviewed.  Constitutional:      General: He is not in acute distress.    Appearance: He is well-developed. He is not diaphoretic.     Comments: Nontoxic appearing and in NAD  HENT:     Head: Normocephalic and atraumatic.     Mouth/Throat:     Comments: Oropharynx clear.  No edema or erythema.  No exudates.  Uvula midline.  Tolerating secretions. Eyes:     General: No scleral icterus.    Conjunctiva/sclera: Conjunctivae normal.  Neck:     Musculoskeletal: Normal range of motion.  Cardiovascular:     Rate and Rhythm: Normal rate and regular rhythm.     Pulses: Normal pulses.  Pulmonary:     Effort: Pulmonary effort is normal. No respiratory distress.     Breath sounds: No stridor. No wheezing, rhonchi or rales.     Comments: Lungs CTAB. No cough appreciated during encounter. Musculoskeletal: Normal range of motion.  Skin:    General: Skin is warm and dry.     Coloration:  Skin is not pale.     Findings: No erythema or rash.  Neurological:     General: No focal deficit present.     Mental Status: He is alert and oriented to person, place, and time.     Coordination: Coordination normal.     Comments: Moving all extremities spontaneously  Psychiatric:        Behavior: Behavior normal.      ED Treatments / Results  Labs (all labs ordered are listed, but only abnormal results are displayed) Labs Reviewed - No data to display  EKG None  Radiology Dg Chest 2 View  Result Date: 12/27/2018 CLINICAL DATA:  Dry cough for 1 year EXAM: CHEST - 2 VIEW COMPARISON:  11/20/2017 FINDINGS: The heart size and mediastinal contours are within normal limits. Both lungs are clear. The visualized skeletal structures are unremarkable. IMPRESSION: No active  cardiopulmonary disease. Electronically Signed   By: Jasmine Pang M.D.   On: 12/27/2018 23:01    Procedures Procedures (including critical care time)  Medications Ordered in ED Medications - No data to display   Initial Impression / Assessment and Plan / ED Course  I have reviewed the triage vital signs and the nursing notes.  Pertinent labs & imaging results that were available during my care of the patient were reviewed by me and considered in my medical decision making (see chart for details).        38 year old male presenting for 1 year of a chronic cough.  He is a smoker.  I have advised that he discontinue smoking.  He underwent chest x-ray which shows no acute cardiopulmonary abnormality.  He is afebrile today without hypoxia.  Given chronicity of symptoms, will refer to primary care for follow-up.  He has been prescribed a short course of Tessalon to use for cough management.  He may also benefit from the use of a daily antihistamine.  Return precautions discussed and provided. Patient discharged in stable condition with no unaddressed concerns.   Final Clinical Impressions(s) / ED Diagnoses   Final diagnoses:  Chronic cough    ED Discharge Orders         Ordered    benzonatate (TESSALON) 100 MG capsule  3 times daily PRN     12/27/18 2306           Antony Madura, PA-C 12/27/18 2314    Alvira Monday, MD 12/28/18 1440

## 2020-04-20 ENCOUNTER — Other Ambulatory Visit: Payer: Self-pay

## 2020-04-20 ENCOUNTER — Emergency Department (HOSPITAL_COMMUNITY)
Admission: EM | Admit: 2020-04-20 | Discharge: 2020-04-20 | Disposition: A | Discharge status: Home / Self Care | Payer: HRSA Program | Attending: Emergency Medicine | Admitting: Emergency Medicine

## 2020-04-20 ENCOUNTER — Encounter (HOSPITAL_COMMUNITY): Payer: Self-pay

## 2020-04-20 DIAGNOSIS — F17210 Nicotine dependence, cigarettes, uncomplicated: Secondary | ICD-10-CM | POA: Diagnosis not present

## 2020-04-20 DIAGNOSIS — Z20822 Contact with and (suspected) exposure to covid-19: Secondary | ICD-10-CM | POA: Insufficient documentation

## 2020-04-20 LAB — SARS CORONAVIRUS 2 BY RT PCR (HOSPITAL ORDER, PERFORMED IN ~~LOC~~ HOSPITAL LAB): SARS Coronavirus 2: NEGATIVE

## 2020-04-20 NOTE — ED Provider Notes (Signed)
[facility] DEPT Provider Note   CSN: [id number] Arrival date & time: 04/20/20  2149     History Chief Complaint  Patient presents with  . Covid Exposure    Kenneth Arellano is a 39 y.o. male.  Patient to ED after finding out that he was around someone last weekend (2-3 days ago) that has since tested positive for COVID. He denies respiratory symptoms, fever, vomiting, pain, or diarrhea.   The history is provided by the patient. No language interpreter was used.       Past Medical History:  Diagnosis Date  . Spontaneous pneumothorax     There are no problems to display for this patient.   Past Surgical History:  Procedure Laterality Date  . CHEST TUBE INSERTION         Family History  Problem Relation Age of Onset  . Hypertension Other     Social History   Tobacco Use  . Smoking status: Current Every Day Smoker    Packs/day: 0.50    Types: Cigarettes  . Smokeless tobacco: Never Used  Substance Use Topics  . Alcohol use: Yes    Comment: social  . Drug use: Yes    Types: Marijuana    Home Medications Prior to Admission medications   Medication Sig Start Date End Date Taking? Authorizing Provider  benzonatate (TESSALON) 100 MG capsule Take 1 capsule (100 mg total) by mouth 3 (three) times daily as needed for cough. 12/27/18   Antonietta Breach, PA-C    Allergies    Patient has no known allergies.  Review of Systems   Review of Systems  Constitutional: Negative for chills and fever.  HENT: Negative.   Respiratory: Negative.   Cardiovascular: Negative.   Gastrointestinal: Negative.   Musculoskeletal: Negative.   Skin: Negative.   Neurological: Negative.     Physical Exam Updated Vital Signs BP 128/87 (BP Location: Right Arm)   Pulse 76   Temp 98.5 F (36.9 C) (Oral)   Resp 18   Ht 6' (1.829 m)   Wt 95.3 kg   SpO2 98%   BMI 28.48 kg/m   Physical Exam Vitals and nursing note reviewed.  Constitutional:       Appearance: He is well-developed.  HENT:     Head: Normocephalic.  Cardiovascular:     Rate and Rhythm: Normal rate and regular rhythm.  Pulmonary:     Effort: Pulmonary effort is normal.     Breath sounds: Normal breath sounds. No wheezing, rhonchi or rales.  Abdominal:     General: Bowel sounds are normal.     Palpations: Abdomen is soft.     Tenderness: There is no abdominal tenderness. There is no guarding or rebound.  Musculoskeletal:        General: Normal range of motion.     Cervical back: Normal range of motion and neck supple.  Skin:    General: Skin is warm and dry.     Findings: No rash.  Neurological:     Mental Status: He is alert and oriented to person, place, and time.     ED Results / Procedures / Treatments   Labs (all labs ordered are listed, but only abnormal results are displayed) Labs Reviewed  SARS CORONAVIRUS 2 BY RT PCR (HOSPITAL ORDER, [facility])    EKG None  Radiology No results found.  Procedures Procedures (including critical care time)  Medications Ordered in ED Medications - No data to display  ED Course  I have reviewed the triage vital signs and the nursing notes.  Pertinent labs & imaging results that were available during my care of the patient were reviewed by me and considered in my medical decision making (see chart for details).    MDM Rules/Calculators/A&P                          Patient with COVID exposure requesting testing.   COVID is negative. He can be discharged home and is encouraged to see his doctor if he develops typical symptoms.   Final Clinical Impression(s) / ED Diagnoses Final diagnoses:  None   1. Exposure to COVID-19  Rx / DC Orders ED Discharge Orders    None       Elpidio Anis, Cordelia Poche 04/20/20 2349    Mesner, Barbara Cower, MD 04/21/20 [id number]

## 2020-04-20 NOTE — Discharge Instructions (Addendum)
See your doctor if you develop any symptoms of fever, cough, body aches, sore throat, congestion or new concern.

## 2020-04-20 NOTE — ED Triage Notes (Signed)
Pt sts covid exposure. Wants to be tested since he has kids.

## 2020-04-28 ENCOUNTER — Emergency Department (HOSPITAL_COMMUNITY)
Admission: EM | Admit: 2020-04-28 | Discharge: 2020-04-28 | Disposition: A | Discharge status: Home / Self Care | Payer: Self-pay | Attending: Emergency Medicine | Admitting: Emergency Medicine

## 2020-04-28 ENCOUNTER — Other Ambulatory Visit: Payer: Self-pay

## 2020-04-28 ENCOUNTER — Encounter (HOSPITAL_COMMUNITY): Payer: Self-pay

## 2020-04-28 DIAGNOSIS — R300 Dysuria: Secondary | ICD-10-CM | POA: Insufficient documentation

## 2020-04-28 DIAGNOSIS — F17210 Nicotine dependence, cigarettes, uncomplicated: Secondary | ICD-10-CM | POA: Insufficient documentation

## 2020-04-28 DIAGNOSIS — R369 Urethral discharge, unspecified: Secondary | ICD-10-CM | POA: Insufficient documentation

## 2020-04-28 DIAGNOSIS — F1210 Cannabis abuse, uncomplicated: Secondary | ICD-10-CM | POA: Insufficient documentation

## 2020-04-28 MED ORDER — DOXYCYCLINE HYCLATE 100 MG PO TABS
100.0000 mg | ORAL_TABLET | Freq: Once | ORAL | Status: AC
  Administered 2020-04-28: 23:00:00 100 mg via ORAL
  Filled 2020-04-28: qty 1

## 2020-04-28 MED ORDER — STERILE WATER FOR INJECTION IJ SOLN
INTRAMUSCULAR | Status: AC
  Filled 2020-04-28: qty 10

## 2020-04-28 MED ORDER — DOXYCYCLINE HYCLATE 100 MG PO CAPS: 100.0000 mg | ORAL_CAPSULE | Freq: Two times a day (BID) | ORAL | 0 refills | Status: AC

## 2020-04-28 MED ORDER — CEFTRIAXONE SODIUM 1 G IJ SOLR
500.0000 mg | Freq: Once | INTRAMUSCULAR | Status: AC
  Administered 2020-04-28: 22:00:00 500 mg via INTRAMUSCULAR
  Filled 2020-04-28: qty 10

## 2020-04-28 NOTE — Discharge Instructions (Addendum)
Please read the instructions below.  Talk with your primary care provider about any new medications.  You have been treated today for gonorrhea and 1 dose towards your treatment for chlamydia. Fill the prescription, doxycycline and take as directed until gone.  You will receive a call from the hospital if your test results come back positive. Avoid sexual activity until you know your test results. If your results come back positive, it is important that you inform all of your sexual partners.  Return to the ER for new or worsening symptoms.

## 2020-04-28 NOTE — ED Notes (Signed)
Patient has urine sample collected if needed.
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
## 2020-04-28 NOTE — ED Triage Notes (Signed)
Patient arrived stating that he has had penile discharge that started today. Reports some discomfort. Requesting STD and HIV check.

## 2020-04-28 NOTE — ED Notes (Signed)
3 Gold tops and 1 Red collected

## 2020-04-28 NOTE — ED Notes (Signed)
An After Visit Summary was printed and given to the patient. Discharge instructions given and no further questions at this time.  

## 2020-04-29 LAB — GC/CHLAMYDIA PROBE AMP (~~LOC~~) NOT AT ARMC
Chlamydia: NEGATIVE
Comment: NEGATIVE
Comment: NORMAL
Neisseria Gonorrhea: POSITIVE — AB

## 2020-04-29 LAB — HIV ANTIBODY (ROUTINE TESTING W REFLEX): HIV Screen 4th Generation wRfx: NONREACTIVE

## 2020-04-29 LAB — RPR: RPR Ser Ql: NONREACTIVE

## 2020-07-29 ENCOUNTER — Ambulatory Visit (HOSPITAL_COMMUNITY)
Admission: EM | Admit: 2020-07-29 | Discharge: 2020-07-29 | Disposition: A | Discharge status: Home / Self Care | Payer: Self-pay | Attending: Family Medicine | Admitting: Family Medicine

## 2020-07-29 ENCOUNTER — Encounter (HOSPITAL_COMMUNITY): Payer: Self-pay | Admitting: Emergency Medicine

## 2020-07-29 ENCOUNTER — Other Ambulatory Visit: Payer: Self-pay

## 2020-07-29 ENCOUNTER — Ambulatory Visit (INDEPENDENT_AMBULATORY_CARE_PROVIDER_SITE_OTHER): Payer: Self-pay

## 2020-07-29 DIAGNOSIS — M25531 Pain in right wrist: Secondary | ICD-10-CM

## 2020-07-29 DIAGNOSIS — S0990XA Unspecified injury of head, initial encounter: Secondary | ICD-10-CM

## 2020-07-29 MED ORDER — DICLOFENAC SODIUM 75 MG PO TBEC: 75.0000 mg | DELAYED_RELEASE_TABLET | Freq: Two times a day (BID) | ORAL | 0 refills | Status: AC
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
## 2020-07-29 NOTE — Discharge Instructions (Addendum)
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
## 2021-03-09 ENCOUNTER — Other Ambulatory Visit: Payer: Self-pay

## 2021-03-09 ENCOUNTER — Emergency Department (HOSPITAL_COMMUNITY)
Admission: EM | Admit: 2021-03-09 | Discharge: 2021-03-09 | Disposition: A | Discharge status: Home / Self Care | Payer: No Typology Code available for payment source | Attending: Emergency Medicine | Admitting: Emergency Medicine

## 2021-03-09 ENCOUNTER — Emergency Department (HOSPITAL_COMMUNITY): Payer: No Typology Code available for payment source

## 2021-03-09 DIAGNOSIS — Y92481 Parking lot as the place of occurrence of the external cause: Secondary | ICD-10-CM | POA: Diagnosis not present

## 2021-03-09 DIAGNOSIS — F17210 Nicotine dependence, cigarettes, uncomplicated: Secondary | ICD-10-CM | POA: Insufficient documentation

## 2021-03-09 DIAGNOSIS — M545 Low back pain, unspecified: Secondary | ICD-10-CM | POA: Diagnosis present

## 2021-03-09 DIAGNOSIS — M542 Cervicalgia: Secondary | ICD-10-CM | POA: Diagnosis not present

## 2021-03-09 MED ORDER — METHOCARBAMOL 500 MG PO TABS: 500.0000 mg | ORAL_TABLET | Freq: Two times a day (BID) | ORAL | 0 refills | Status: AC

## 2021-03-09 MED ORDER — KETOROLAC TROMETHAMINE 60 MG/2ML IM SOLN
60.0000 mg | Freq: Once | INTRAMUSCULAR | Status: AC
  Administered 2021-03-09: 60 mg via INTRAMUSCULAR
  Filled 2021-03-09: qty 2

## 2021-03-09 NOTE — Discharge Instructions (Addendum)
Take NSAIDs or Tylenol as needed for the next week. Take this medicine with food. Take muscle relaxer at bedtime to help you sleep. This medicine makes you drowsy so do not take before driving or work Use a heating pad for sore muscles - use for 20 minutes several times a day Try gentle range of motion exercises Return for worsening symptoms  
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
## 2021-03-09 NOTE — ED Notes (Signed)
An After Visit Summary was printed and given to the patient. Discharge instructions given and no further questions at this time.  
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
## 2021-10-28 ENCOUNTER — Other Ambulatory Visit: Payer: Self-pay

## 2021-10-28 ENCOUNTER — Encounter (HOSPITAL_COMMUNITY): Payer: Self-pay

## 2021-10-28 ENCOUNTER — Emergency Department (HOSPITAL_COMMUNITY)
Admission: EM | Admit: 2021-10-28 | Discharge: 2021-10-28 | Disposition: A | Discharge status: Home / Self Care | Payer: Medicaid Other | Attending: Emergency Medicine | Admitting: Emergency Medicine

## 2021-10-28 DIAGNOSIS — R369 Urethral discharge, unspecified: Secondary | ICD-10-CM | POA: Insufficient documentation

## 2021-10-28 DIAGNOSIS — Z202 Contact with and (suspected) exposure to infections with a predominantly sexual mode of transmission: Secondary | ICD-10-CM | POA: Insufficient documentation

## 2021-10-28 DIAGNOSIS — N4889 Other specified disorders of penis: Secondary | ICD-10-CM | POA: Insufficient documentation

## 2021-10-28 LAB — URINALYSIS, ROUTINE W REFLEX MICROSCOPIC
Bacteria, UA: NONE SEEN
Bilirubin Urine: NEGATIVE
Glucose, UA: NEGATIVE mg/dL
Hgb urine dipstick: NEGATIVE
Ketones, ur: NEGATIVE mg/dL
Nitrite: NEGATIVE
Protein, ur: NEGATIVE mg/dL
Specific Gravity, Urine: 1.015 (ref 1.005–1.030)
WBC, UA: >50 WBC/hpf — ABNORMAL HIGH (ref 0–5)
pH: 8 (ref 5.0–8.0)

## 2021-10-28 MED ORDER — CEFTRIAXONE SODIUM 1 G IJ SOLR
500.0000 mg | Freq: Once | INTRAMUSCULAR | Status: AC
  Administered 2021-10-28: 500 mg via INTRAMUSCULAR
  Filled 2021-10-28: qty 10

## 2021-10-28 MED ORDER — DOXYCYCLINE HYCLATE 100 MG PO CAPS: 100.0000 mg | ORAL_CAPSULE | Freq: Two times a day (BID) | ORAL | 0 refills | Status: DC

## 2021-10-28 MED ORDER — STERILE WATER FOR INJECTION IJ SOLN
INTRAMUSCULAR | Status: AC
  Filled 2021-10-28: qty 10

## 2021-10-28 NOTE — ED Triage Notes (Signed)
Pt reports a yellowish discharge from penis x 2 days associated with generalized penile discomfort.

## 2021-10-28 NOTE — ED Provider Notes (Signed)
Kenneth Arellano Note   CSN: [id number] Arrival date & time: 10/28/21  2031     History  Chief Complaint  Patient presents with   Penile Discharge    Kenneth Arellano is a 41 y.o. male who presents the emergency department with 3-day history of penile discharge.  Patient states he has been having yellowish discharge for the last 3 days.  Patient has multiple sexual partners and unprotected sex.  Patient does have a history of STDs in the past and states this feels similar.  He does also report associated mild pain at the base of the penis.  No fever, chills, dysuria, urgency, frequency.  No testicular pain.   Penile Discharge      Home Medications Prior to Admission medications   Medication Sig Start Date End Date Taking? Authorizing Arellano  doxycycline (VIBRAMYCIN) 100 MG capsule Take 1 capsule (100 mg total) by mouth 2 (two) times daily. 10/28/21  Yes Raul Del, Azazel Franze M, PA-C  diclofenac (VOLTAREN) 75 MG EC tablet Take 1 tablet (75 mg total) by mouth 2 (two) times daily. 07/29/20   Vanessa Kick, MD  methocarbamol (ROBAXIN) 500 MG tablet Take 1 tablet (500 mg total) by mouth 2 (two) times daily. 03/09/21   Garald Balding, PA-C      Allergies    Patient has no known allergies.    Review of Systems   Review of Systems  Genitourinary:  Positive for penile discharge.  All other systems reviewed and are negative.  Physical Exam Updated Vital Signs BP (!) 115/98 (BP Location: Left Arm)    Pulse 87    Temp 98.9 F (37.2 C) (Oral)    Resp 16    Ht 6\' 1"  (1.854 m)    Wt 93 kg    SpO2 99%    BMI 27.05 kg/m  Physical Exam Vitals and nursing note reviewed.  Constitutional:      Appearance: Normal appearance.  HENT:     Head: Normocephalic and atraumatic.  Eyes:     General:        Right eye: No discharge.        Left eye: No discharge.     Conjunctiva/sclera: Conjunctivae normal.  Pulmonary:     Effort: Pulmonary effort is normal.   Genitourinary:    Penis: Normal.      Epididymis:     Right: Normal.     Left: Normal.     Comments: Testicles appear normal and are normal lie.  No evidence of masses. Skin:    General: Skin is warm and dry.     Findings: No rash.  Neurological:     General: No focal deficit present.     Mental Status: He is alert.  Psychiatric:        Mood and Affect: Mood normal.        Behavior: Behavior normal.    ED Results / Procedures / Treatments   Labs (all labs ordered are listed, but only abnormal results are displayed) Labs Reviewed  URINALYSIS, ROUTINE W REFLEX MICROSCOPIC - Abnormal; Notable for the following components:      Result Value   APPearance TURBID (*)    Leukocytes,Ua LARGE (*)    WBC, UA >50 (*)    All other components within normal limits  GC/CHLAMYDIA PROBE AMP ([place]) NOT AT [facility]    EKG None  Radiology No results found.  Procedures Procedures   Medications Ordered in ED Medications  cefTRIAXone (ROCEPHIN)  injection 500 mg (has no administration in time range)    ED Course/ Medical Decision Making/ A&P                           Medical Decision Making  [name] is a 41 y.o. male who presents to the emergency department with STD symptoms.  History is consistent with possible STD infection.  Given his risk factors I am suspicious that this is an STD infection.  I also considered epididymitis osteoarthritis.  I considered but have a low suspicion for testicular torsion but testicles are in normal lie and not painful.  I ordered and personally interpreted a urinalysis which showed pyuria without any obvious nitrite growth.  Given the clinical scenario, this does fit in with possible STD infection.  Gonorrhea and Chlamydia was sent off via urine and is pending.  I will give him a dose of ceftriaxone intramuscularly in the emergency department and give him a prescription for doxycycline for the next 7 days.  Given him strict return precautions in  the event this does not get better.  He expressed full understanding.  He is safe for discharge.   Final Clinical Impression(s) / ED Diagnoses Final diagnoses:  Penile discharge    Rx / DC Orders ED Discharge Orders          Ordered    doxycycline (VIBRAMYCIN) 100 MG capsule  2 times daily        10/28/21 2237              Hendricks Limes, PA-C 10/28/21 2239    Valarie Merino, MD 10/30/21 1743

## 2021-10-28 NOTE — Discharge Instructions (Signed)
Please take antibiotics as prescribed.  Please return if symptoms do not improve, if you experience testicular pain, or any other concerns you might have.

## 2021-10-29 LAB — GC/CHLAMYDIA PROBE AMP (~~LOC~~) NOT AT ARMC
Chlamydia: POSITIVE — AB
Comment: NEGATIVE
Comment: NORMAL
Neisseria Gonorrhea: POSITIVE — AB

## 2022-01-05 ENCOUNTER — Ambulatory Visit (HOSPITAL_COMMUNITY)
Admission: EM | Admit: 2022-01-05 | Discharge: 2022-01-05 | Disposition: A | Discharge status: Home / Self Care | Payer: Medicaid Other | Attending: Family Medicine | Admitting: Family Medicine

## 2022-01-05 ENCOUNTER — Encounter (HOSPITAL_COMMUNITY): Payer: Self-pay

## 2022-01-05 ENCOUNTER — Other Ambulatory Visit: Payer: Self-pay

## 2022-01-05 DIAGNOSIS — R369 Urethral discharge, unspecified: Secondary | ICD-10-CM

## 2022-01-05 DIAGNOSIS — K5900 Constipation, unspecified: Secondary | ICD-10-CM

## 2022-01-05 DIAGNOSIS — Z711 Person with feared health complaint in whom no diagnosis is made: Secondary | ICD-10-CM

## 2022-01-05 LAB — HIV ANTIBODY (ROUTINE TESTING W REFLEX): HIV Screen 4th Generation wRfx: NONREACTIVE

## 2022-01-05 MED ORDER — DOXYCYCLINE HYCLATE 100 MG PO CAPS: 100.0000 mg | ORAL_CAPSULE | Freq: Two times a day (BID) | ORAL | 0 refills | Status: AC

## 2022-01-05 MED ORDER — CEFTRIAXONE SODIUM 500 MG IJ SOLR
INTRAMUSCULAR | Status: AC
  Filled 2022-01-05: qty 500

## 2022-01-05 MED ORDER — LIDOCAINE HCL (PF) 1 % IJ SOLN
INTRAMUSCULAR | Status: AC
  Filled 2022-01-05: qty 2

## 2022-01-05 MED ORDER — CEFTRIAXONE SODIUM 500 MG IJ SOLR
500.0000 mg | Freq: Once | INTRAMUSCULAR | Status: AC
  Administered 2022-01-05: 500 mg via INTRAMUSCULAR

## 2022-01-05 NOTE — ED Triage Notes (Signed)
Pt is here for exposure to STD. He does report penile discharge x 2 days. He reported his penis is bleeding after sexual intercourse. ?Pt reports anus hurt. ?

## 2022-01-05 NOTE — Discharge Instructions (Signed)

## 2022-01-06 LAB — RPR: RPR Ser Ql: NONREACTIVE

## 2022-01-06 LAB — CYTOLOGY, (ORAL, ANAL, URETHRAL) ANCILLARY ONLY
Chlamydia: NEGATIVE
Comment: NEGATIVE
Comment: NEGATIVE
Comment: NORMAL
Neisseria Gonorrhea: POSITIVE — AB
Trichomonas: NEGATIVE

## 2022-01-06 NOTE — ED Provider Notes (Addendum)
?[facility] ? ? ?[id number] ?01/05/22 Arrival Time: 1520 ? ?ASSESSMENT & PLAN: ? ?1. Penile discharge   ?2. Concern about STD in male without diagnosis   ?3. Constipation, unspecified constipation type   ? ?Meds ordered this encounter  ?Medications  ? doxycycline (VIBRAMYCIN) 100 MG capsule  ?  Sig: Take 1 capsule (100 mg total) by mouth 2 (two) times daily.  ?  Dispense:  14 capsule  ?  Refill:  0  ? cefTRIAXone (ROCEPHIN) injection 500 mg  ? ?Will try OTC Miralax for constipation. ? ? ? ?Discharge Instructions   ? ?  ?You have been given the following today for treatment of suspected gonorrhea and/or chlamydia: ? ?cefTRIAXone (ROCEPHIN) injection 500 mg ? ?Please pick up your prescription for doxycycline 100 mg and begin taking twice daily for the next seven (7) days. ? ?Even though we have treated you today, we have sent testing for sexually transmitted infections. We will notify you of any positive results once they are received. If required, we will prescribe any medications you might need. ? ?Please refrain from all sexual activity for at least the next seven days. ? ? ? ? ?Pending: ?Labs Reviewed  ?RPR  ?HIV ANTIBODY (ROUTINE TESTING W REFLEX)  ?CYTOLOGY, (ORAL, ANAL, URETHRAL) ANCILLARY ONLY  ? ? ?Will notify of any positive results. Instructed to refrain from sexual activity for at least seven days. ? ?Reviewed expectations re: course of current medical issues. Questions answered. ?Outlined signs and symptoms indicating need for more acute intervention. ?Patient verbalized understanding. ?After Visit Summary given. ? ? ?SUBJECTIVE: ? ?Kenneth Arellano is a 41 y.o. male who presents with complaint of penile discharge. Onset gradual. First noticed few d ago. Describes discharge as thick and opaque. No specific aggravating or alleviating factors reported. Denies: urinary frequency, dysuria, and gross hematuria. Afebrile. No abdominal or pelvic pain. No n/v. No rashes or lesions. Reports that he is  sexually active with single male partner. ?OTC treatment: none. ? ?OBJECTIVE: ? ?Vitals:  ? 01/05/22 1629  ?BP: (!) 112/96  ?Pulse: 76  ?Resp: 18  ?Temp: 97.6 ?F (36.4 ?C)  ?TempSrc: Oral  ?SpO2: 100%  ?  ?General appearance: alert, cooperative, appears stated age and no distress ?Throat: lips, mucosa, and tongue normal; teeth and gums normal ?Lungs: unlabored respirations; speaks full sentences without difficulty ?Back: no CVA tenderness; FROM at waist ?Abdomen: soft, non-tender ?GU: deferred ?Skin: warm and dry ?Psychological: alert and cooperative; normal mood and affect. ? ? ? ?Labs Reviewed  ?RPR  ?HIV ANTIBODY (ROUTINE TESTING W REFLEX)  ?CYTOLOGY, (ORAL, ANAL, URETHRAL) ANCILLARY ONLY  ? ? ?No Known Allergies ? ?Past Medical History:  ?Diagnosis Date  ? Spontaneous pneumothorax   ? ?Family History  ?Problem Relation Age of Onset  ? Hypertension Other   ? ?Social History  ? ?Socioeconomic History  ? Marital status: Single  ?  Spouse name: Not on file  ? Number of children: Not on file  ? Years of education: Not on file  ? Highest education level: Not on file  ?Occupational History  ? Not on file  ?Tobacco Use  ? Smoking status: Every Day  ?  Packs/day: 0.50  ?  Types: Cigarettes  ? Smokeless tobacco: Never  ?Substance and Sexual Activity  ? Alcohol use: Yes  ?  Comment: social  ? Drug use: Yes  ?  Types: Marijuana  ? Sexual activity: Yes  ?Other Topics Concern  ? Not on file  ?Social History Narrative  ?  Not on file  ? ?Social Determinants of Health  ? ?Financial Resource Strain: Not on file  ?Food Insecurity: Not on file  ?Transportation Needs: Not on file  ?Physical Activity: Not on file  ?Stress: Not on file  ?Social Connections: Not on file  ?Intimate Partner Violence: Not on file  ? ? ? ? ? ? ? ?  ?Mardella Layman, MD ?01/06/22 [phone] ? ?  ?Mardella Layman, MD ?01/06/22 [phone] ? ?
